# Patient Record
Sex: Female | Born: 1985
Health system: Southern US, Community
[De-identification: ages and names within clinical notes are randomized; demographics above are authoritative.]

## PROBLEM LIST (undated history)

## (undated) DIAGNOSIS — F32A Depression, unspecified: Secondary | ICD-10-CM

## (undated) DIAGNOSIS — F329 Major depressive disorder, single episode, unspecified: Secondary | ICD-10-CM

## (undated) DIAGNOSIS — D649 Anemia, unspecified: Secondary | ICD-10-CM

## (undated) HISTORY — DX: Major depressive disorder, single episode, unspecified: F32.9

## (undated) HISTORY — DX: Depression, unspecified: F32.A

## (undated) HISTORY — DX: Anemia, unspecified: D64.9

---

## 2010-12-07 HISTORY — PX: OTHER SURGICAL HISTORY: SHX169

## 2013-01-02 ENCOUNTER — Encounter (HOSPITAL_COMMUNITY): Payer: Self-pay | Admitting: Emergency Medicine

## 2013-01-02 ENCOUNTER — Emergency Department (HOSPITAL_COMMUNITY)
Admission: EM | Admit: 2013-01-02 | Discharge: 2013-01-02 | Disposition: A | Discharge status: Home / Self Care | Payer: BC Managed Care – PPO | Attending: Emergency Medicine | Admitting: Emergency Medicine

## 2013-01-02 ENCOUNTER — Emergency Department (HOSPITAL_COMMUNITY): Payer: BC Managed Care – PPO

## 2013-01-02 DIAGNOSIS — R109 Unspecified abdominal pain: Secondary | ICD-10-CM

## 2013-01-02 DIAGNOSIS — R1031 Right lower quadrant pain: Secondary | ICD-10-CM | POA: Insufficient documentation

## 2013-01-02 DIAGNOSIS — Z3202 Encounter for pregnancy test, result negative: Secondary | ICD-10-CM | POA: Insufficient documentation

## 2013-01-02 DIAGNOSIS — R630 Anorexia: Secondary | ICD-10-CM | POA: Insufficient documentation

## 2013-01-02 LAB — URINE MICROSCOPIC-ADD ON

## 2013-01-02 LAB — CBC WITH DIFFERENTIAL/PLATELET
Basophils Absolute: 0 10*3/uL (ref 0.0–0.1)
Eosinophils Absolute: 0.1 10*3/uL (ref 0.0–0.7)
Eosinophils Relative: 1 % (ref 0–5)
Hemoglobin: 15.3 g/dL — ABNORMAL HIGH (ref 12.0–15.0)
Lymphocytes Relative: 29 % (ref 12–46)
Lymphs Abs: 1.9 10*3/uL (ref 0.7–4.0)
MCH: 31.1 pg (ref 26.0–34.0)
MCV: 89.6 fL (ref 78.0–100.0)
Neutro Abs: 4.3 10*3/uL (ref 1.7–7.7)
Neutrophils Relative %: 64 % (ref 43–77)
Platelets: 361 10*3/uL (ref 150–400)
RBC: 4.92 MIL/uL (ref 3.87–5.11)
RDW: 12.8 % (ref 11.5–15.5)
WBC: 6.6 10*3/uL (ref 4.0–10.5)

## 2013-01-02 LAB — URINALYSIS, ROUTINE W REFLEX MICROSCOPIC
Bilirubin Urine: NEGATIVE
Glucose, UA: NEGATIVE mg/dL
Ketones, ur: NEGATIVE mg/dL
Nitrite: NEGATIVE
Specific Gravity, Urine: 1.02 (ref 1.005–1.030)
pH: 7.5 (ref 5.0–8.0)

## 2013-01-02 LAB — COMPREHENSIVE METABOLIC PANEL
Albumin: 4.3 g/dL (ref 3.5–5.2)
Alkaline Phosphatase: 80 U/L (ref 39–117)
BUN: 10 mg/dL (ref 6–23)
CO2: 26 mEq/L (ref 19–32)
Chloride: 102 mEq/L (ref 96–112)
Creatinine, Ser: 0.54 mg/dL (ref 0.50–1.10)
GFR calc Af Amer: >90 mL/min (ref >90)
GFR calc non Af Amer: >90 mL/min (ref >90)
Glucose, Bld: 102 mg/dL — ABNORMAL HIGH (ref 70–99)
Potassium: 3.8 mEq/L (ref 3.5–5.1)
Total Bilirubin: 0.4 mg/dL (ref 0.3–1.2)

## 2013-01-02 LAB — WET PREP, GENITAL: Yeast Wet Prep HPF POC: NONE SEEN

## 2013-01-02 LAB — POCT PREGNANCY, URINE: Preg Test, Ur: NEGATIVE

## 2013-01-02 MED ORDER — ONDANSETRON HCL 4 MG/2ML IJ SOLN
4.0000 mg | Freq: Once | INTRAMUSCULAR | Status: DC
  Filled 2013-01-02: qty 2

## 2013-01-02 MED ORDER — IOHEXOL 300 MG/ML  SOLN
25.0000 mL | INTRAMUSCULAR | Status: AC
  Administered 2013-01-02 (×2): 25 mL via ORAL

## 2013-01-02 MED ORDER — SODIUM CHLORIDE 0.9 % IV BOLUS (SEPSIS)
1000.0000 mL | Freq: Once | INTRAVENOUS | Status: AC
  Administered 2013-01-02: 1000 mL via INTRAVENOUS

## 2013-01-02 MED ORDER — OXYCODONE-ACETAMINOPHEN 5-325 MG PO TABS: 1.0000 | ORAL_TABLET | ORAL | Status: DC | PRN

## 2013-01-02 MED ORDER — ONDANSETRON HCL 4 MG/2ML IJ SOLN
4.0000 mg | Freq: Once | INTRAMUSCULAR | Status: AC
  Administered 2013-01-02: 4 mg via INTRAVENOUS

## 2013-01-02 MED ORDER — IOHEXOL 300 MG/ML  SOLN
100.0000 mL | Freq: Once | INTRAMUSCULAR | Status: AC | PRN
  Administered 2013-01-02: 100 mL via INTRAVENOUS

## 2013-01-02 MED ORDER — PROMETHAZINE HCL 25 MG PO TABS: 25.0000 mg | ORAL_TABLET | Freq: Four times a day (QID) | ORAL | Status: DC | PRN

## 2013-01-02 NOTE — ED Notes (Signed)
Pt reports difficulty having BM, pt states she has to strain to have a BM which is not normal. Pt states stools are hard when she is able to pass a BM.

## 2013-01-02 NOTE — ED Provider Notes (Signed)
CSN: [id number]     Arrival date & time 01/02/13  1338 History   First MD Initiated Contact with Patient 01/02/13 1508     Chief Complaint  Patient presents with  . Abdominal Pain   (Consider location/radiation/quality/duration/timing/severity/associated sxs/prior Treatment) HPI...Marland Kitchen. cramping right-sided abdominal pain for 3-4 days with associated anorexia.   Last menstrual period 5 days ago. No dysuria, fever, chills, vaginal bleeding, vaginal discharge. Nothing makes symptoms worse. No radiation of pain  No past medical history on file. No past surgical history on file. No family history on file. History  Substance Use Topics  . Smoking status: Never Smoker   . Smokeless tobacco: Not on file  . Alcohol Use: No   OB History   Grav Para Term Preterm Abortions TAB SAB Ect Mult Living                 Review of Systems  All other systems reviewed and are negative.    Allergies  Review of patient's allergies indicates no known allergies.  Home Medications  No current outpatient prescriptions on file. BP 97/47  Pulse 70  Temp(Src) 98.2 F (36.8 C) (Oral)  Resp 16  Ht 5\' 1"  (1.549 m)  Wt 112 lb (50.803 kg)  BMI 21.17 kg/m2  SpO2 100%  LMP 12/28/2012 Physical Exam  Nursing note and vitals reviewed. Constitutional: She is oriented to person, place, and time. She appears well-developed and well-nourished.  HENT:  Head: Normocephalic and atraumatic.  Eyes: Conjunctivae and EOM are normal. Pupils are equal, round, and reactive to light.  Neck: Normal range of motion. Neck supple.  Cardiovascular: Normal rate, regular rhythm and normal heart sounds.   Pulmonary/Chest: Effort normal and breath sounds normal.  Abdominal: Soft. Bowel sounds are normal.  Tender right lower quadrant  Genitourinary:  Normal external genitalia. Cervix has a small amount of old blood. No cervical motion tenderness. No adnexal tenderness.  Musculoskeletal: Normal range of motion.  Neurological:  She is alert and oriented to person, place, and time.  Skin: Skin is warm and dry.  Psychiatric: She has a normal mood and affect.    ED Course  Procedures (including critical care time) Labs Review Labs Reviewed  URINALYSIS, ROUTINE W REFLEX MICROSCOPIC - Abnormal; Notable for the following:    Hgb urine dipstick TRACE (*)    All other components within normal limits  COMPREHENSIVE METABOLIC PANEL - Abnormal; Notable for the following:    Glucose, Bld 102 (*)    ALT 44 (*)    All other components within normal limits  CBC WITH DIFFERENTIAL - Abnormal; Notable for the following:    Hemoglobin 15.3 (*)    All other components within normal limits  URINE MICROSCOPIC-ADD ON - Abnormal; Notable for the following:    Squamous Epithelial / LPF FEW (*)    All other components within normal limits  WET PREP, GENITAL  GC/CHLAMYDIA PROBE AMP  POCT PREGNANCY, URINE   Imaging Review No results found. Ct Abdomen Pelvis W Contrast  01/02/2013   CLINICAL DATA:  Right lower abdominal pain  EXAM: CT ABDOMEN AND PELVIS WITH CONTRAST  TECHNIQUE: Multidetector CT imaging of the abdomen and pelvis was performed using the standard protocol following bolus administration of intravenous contrast.  CONTRAST:  OMNIPAQUE IOHEXOL 300 MG/ML  SOLN  COMPARISON:  None.  FINDINGS: Minimal dependent atelectasis in the visualized lung bases. Unremarkable liver, gallbladder, spleen, adrenal glands, pancreas, kidneys, abdominal aorta. Stomach, small bowel, and colon nondilated. Gas and oral  contrast in the appendix. No significant adjacent inflammatory/edematous change. No extraluminal fluid collections to suggest abscess. A few prominent right lower quadrant mesenteric lymph nodes, nearly all less than 5mm short axis diameter. Urinary bladder physiologically distended. Uterus and adnexal regions unremarkable. No ascites. No free air. Portal vein patent. Lumbar spine unremarkable.  IMPRESSION: 1. No acute abnormality.  Normal appendix.   Electronically Signed   By: Oley Balm M.D.   On: 01/02/2013 20:34    EKG Interpretation   None       MDM  No diagnosis found. CT scan shows no evidence of appendicitis. Patient is ambulatory. No acute abdomen. Discharge medications Percocet and Phenergan 25 mg    Donnetta Hutching, MD 01/02/13 2155

## 2013-01-02 NOTE — ED Notes (Addendum)
PT reports her belly is hurting and feels very bloated since Saturday. RLQ pain that has spread to epigastric area. Reports chills as well. Pain comes and goes. Reports belly is nontender to palpation. Nothing makes pain worse or better.

## 2013-01-02 NOTE — ED Notes (Signed)
Pt states she has finished her contrast drink, Diplomatic Services operational officer called and informed CT.

## 2013-01-02 NOTE — ED Notes (Signed)
MD at Bedside.

## 2013-01-03 LAB — GC/CHLAMYDIA PROBE AMP
CT Probe RNA: NEGATIVE
GC Probe RNA: NEGATIVE

## 2014-01-28 ENCOUNTER — Other Ambulatory Visit: Payer: Self-pay | Admitting: Gastroenterology

## 2014-01-28 DIAGNOSIS — R1013 Epigastric pain: Secondary | ICD-10-CM

## 2014-02-09 ENCOUNTER — Encounter (HOSPITAL_COMMUNITY)
Admission: RE | Admit: 2014-02-09 | Discharge: 2014-02-09 | Disposition: A | Discharge status: Home / Self Care | Payer: BC Managed Care – PPO | Source: Ambulatory Visit | Attending: Gastroenterology | Admitting: Gastroenterology

## 2014-02-09 DIAGNOSIS — R1013 Epigastric pain: Secondary | ICD-10-CM | POA: Diagnosis present

## 2014-02-09 MED ORDER — SINCALIDE 5 MCG IJ SOLR
0.0200 ug/kg | Freq: Once | INTRAMUSCULAR | Status: AC
  Administered 2014-02-09: 5 ug via INTRAVENOUS

## 2014-02-09 MED ORDER — STERILE WATER FOR INJECTION IJ SOLN
INTRAMUSCULAR | Status: AC
  Filled 2014-02-09: qty 10

## 2014-02-09 MED ORDER — TECHNETIUM TC 99M MEBROFENIN IV KIT
5.0000 | PACK | Freq: Once | INTRAVENOUS | Status: AC | PRN
  Administered 2014-02-09: 5 via INTRAVENOUS

## 2014-02-09 MED ORDER — SINCALIDE 5 MCG IJ SOLR
INTRAMUSCULAR | Status: AC
  Filled 2014-02-09: qty 5

## 2015-12-28 ENCOUNTER — Ambulatory Visit (INDEPENDENT_AMBULATORY_CARE_PROVIDER_SITE_OTHER): Payer: BLUE CROSS/BLUE SHIELD

## 2015-12-28 ENCOUNTER — Ambulatory Visit (INDEPENDENT_AMBULATORY_CARE_PROVIDER_SITE_OTHER): Payer: BLUE CROSS/BLUE SHIELD | Admitting: Urgent Care

## 2015-12-28 ENCOUNTER — Encounter: Payer: Self-pay | Admitting: Urgent Care

## 2015-12-28 VITALS — BP 118/74 | HR 86 | Temp 97.6°F | Resp 16 | Ht 61.75 in | Wt 125.2 lb

## 2015-12-28 DIAGNOSIS — M25561 Pain in right knee: Secondary | ICD-10-CM | POA: Diagnosis not present

## 2015-12-28 DIAGNOSIS — S99921A Unspecified injury of right foot, initial encounter: Secondary | ICD-10-CM | POA: Diagnosis not present

## 2015-12-28 DIAGNOSIS — M7989 Other specified soft tissue disorders: Secondary | ICD-10-CM | POA: Diagnosis not present

## 2015-12-28 DIAGNOSIS — M79674 Pain in right toe(s): Secondary | ICD-10-CM | POA: Diagnosis not present

## 2015-12-28 DIAGNOSIS — S8001XA Contusion of right knee, initial encounter: Secondary | ICD-10-CM

## 2015-12-28 DIAGNOSIS — S92404A Nondisplaced unspecified fracture of right great toe, initial encounter for closed fracture: Secondary | ICD-10-CM

## 2015-12-28 DIAGNOSIS — W108XXA Fall (on) (from) other stairs and steps, initial encounter: Secondary | ICD-10-CM

## 2015-12-28 MED ORDER — NAPROXEN SODIUM 550 MG PO TABS: 550.0000 mg | ORAL_TABLET | Freq: Two times a day (BID) | ORAL | 1 refills | Status: DC

## 2015-12-28 MED ORDER — HYDROCODONE-ACETAMINOPHEN 5-325 MG PO TABS: 1.0000 | ORAL_TABLET | Freq: Four times a day (QID) | ORAL | 0 refills | Status: DC | PRN

## 2015-12-28 NOTE — Patient Instructions (Signed)
Toe Fracture °A toe fracture is a break in one of the toe bones (phalanges). °CAUSES °This condition may be caused by: °· Dropping a heavy object on your toe. °· Stubbing your toe. °· Overusing your toe or doing repetitive exercise. °· Twisting or stretching your toe out of place. °RISK FACTORS °This condition is more likely to develop in people who: °· Play contact sports. °· Have a bone disease. °· Have a low calcium level. °SYMPTOMS °The main symptoms of this condition are swelling and pain in the toe. The pain may get worse with standing or walking. Other symptoms include: °· Bruising. °· Stiffness. °· Numbness. °· A change in the way the toe looks. °· Broken bones that poke through the skin. °· Blood beneath the toenail. °DIAGNOSIS °This condition is diagnosed with a physical exam. You may also have X-rays. °TREATMENT  °Treatment for this condition depends on the type of fracture and its severity. Treatment may involve: °· Taping the broken toe to a toe that is next to it (buddy taping). This is the most common treatment for fractures in which the bone has not moved out of place (nondisplaced fracture). °· Wearing a shoe that has a wide, rigid sole to protect the toe and to limit its movement. °· Wearing a walking cast. °· Having a procedure to move the toe back into place. °· Surgery. This may be needed: °¨ If there are many pieces of broken bone that are out of place (displaced). °¨ If the toe joint breaks. °¨ If the bone breaks through the skin. °· Physical therapy. This is done to help regain movement and strength in the toe. °You may need follow-up X-rays to make sure that the bone is healing well and staying in position. °HOME CARE INSTRUCTIONS °If You Have a Cast: °· Do not stick anything inside the cast to scratch your skin. Doing that increases your risk of infection. °· Check the skin around the cast every day. Report any concerns to your health care provider. You may put lotion on dry skin around the  edges of the cast. Do not apply lotion to the skin underneath the cast. °· Do not put pressure on any part of the cast until it is fully hardened. This may take several hours. °· Keep the cast clean and dry. °Bathing °· Do not take baths, swim, or use a hot tub until your health care provider approves. Ask your health care provider if you can take showers. You may only be allowed to take sponge baths for bathing. °· If your health care provider approves bathing and showering, cover the cast or bandage (dressing) with a watertight plastic bag to protect it from water. Do not let the cast or dressing get wet. °Managing Pain, Stiffness, and Swelling °· If you do not have a cast, apply ice to the injured area, if directed. °¨ Put ice in a plastic bag. °¨ Place a towel between your skin and the bag. °¨ Leave the ice on for 20 minutes, 2-3 times per day. °· Move your toes often to avoid stiffness and to lessen swelling. °· Raise (elevate) the injured area above the level of your heart while you are sitting or lying down. °Driving °· Do not drive or operate heavy machinery while taking pain medicine. °· Do not drive while wearing a cast on a foot that you use for driving. °Activity °· Return to your normal activities as directed by your health care provider. Ask your health care   provider what activities are safe for you. °· Perform exercises daily as directed by your health care provider or physical therapist. °Safety °· Do not use the injured limb to support your body weight until your health care provider says that you can. Use crutches or other assistive devices as directed by your health care provider. °General Instructions °· If your toe was treated with buddy taping, follow your health care provider's instructions for changing the gauze and tape. Change it more often: °¨ The gauze and tape get wet. If this happens, dry the space between the toes. °¨ The gauze and tape are too tight and cause your toe to become pale  or numb. °· Wear a protective shoe as directed by your health care provider. If you were not given a protective shoe, wear sturdy, supportive shoes. Your shoes should not pinch your toes and should not fit tightly against your toes. °· Do not use any tobacco products, including cigarettes, chewing tobacco, or e-cigarettes. Tobacco can delay bone healing. If you need help quitting, ask your health care provider. °· Take medicines only as directed by your health care provider. °· Keep all follow-up visits as directed by your health care provider. This is important. °SEEK MEDICAL CARE IF: °· You have a fever. °· Your pain medicine is not helping. °· Your toe is cold. °· Your toe is numb. °· You still have pain after one week of rest and treatment. °· You still have pain after your health care provider has said that you can start walking again. °· You have pain, tingling, or numbness in your foot that is not going away. °SEEK IMMEDIATE MEDICAL CARE IF: °· You have severe pain. °· You have redness or inflammation in your toe that is getting worse. °· You have pain or numbness in your toe that is getting worse. °· Your toe turns blue. °  °This information is not intended to replace advice given to you by your health care provider. Make sure you discuss any questions you have with your health care provider. °  °Document Released: 02/10/2000 Document Revised: 11/03/2014 Document Reviewed: 12/09/2013 °Elsevier Interactive Patient Education ©2016 [employer] ° °

## 2015-12-28 NOTE — Progress Notes (Signed)
     IF you received an x-ray today, you will receive an invoice from [facility]. Please contact [facility] at [phone] with questions or concerns regarding your invoice.   IF you received labwork today, you will receive an invoice from [facility]. Please contact [facility] at [phone] with questions or concerns regarding your invoice.   Our billing staff will not be able to assist you with questions regarding bills from these companies.  You will be contacted with the lab results as soon as they are available. The fastest way to get your results is to activate your My Chart account. Instructions are located on the last page of this paperwork. If you have not heard from us regarding the results in 2 weeks, please contact this office.      

## 2015-12-28 NOTE — Progress Notes (Signed)
MRN: [id number] DOB: 04/22/1985  Subjective:   Sarah Haynes is a 30 y.o. female presenting for chief complaint of Toe Injury (R Big Toe 10/29)  Reports 2 day history of right big toe injury. Has since felt intermittent severe pain, worse with touch and bearing weight. She has also had swelling, difficulty ambulating, bruising, numbness over bottom of toe. Has tried icing, Bengay, ibuprofen with minimal relief.  Vernona RiegerLaura is not currently taking any medications. Also has No Known Allergies.  Vernona RiegerLaura  has a past medical history of Anemia and Depression. Also  has a past surgical history that includes bilateral symphatomy (12/07/2010).  Objective:   Vitals: BP 118/74 (BP Location: Right Arm, Patient Position: Sitting, Cuff Size: Normal)   Pulse 86   Temp 97.6 F (36.4 C) (Oral)   Resp 16   Ht 5' 1.75" (1.568 m)   Wt 125 lb 3.2 oz (56.8 kg)   LMP 12/14/2015 (Exact Date)   BMI 23.09 kg/m   Physical Exam  Constitutional: She is oriented to person, place, and time. She appears well-developed and well-nourished.  Cardiovascular: Normal rate.   Pulmonary/Chest: Effort normal.  Musculoskeletal:       Right knee: She exhibits decreased range of motion (full flexion), swelling (trace over inferior patella, patellar ligament) and ecchymosis (over inferior patella, patellar ligament). She exhibits no effusion, no deformity, no laceration, no erythema, normal alignment and normal patellar mobility. No medial joint line, no lateral joint line, no MCL, no LCL and no patellar tendon tenderness noted.       Right foot: There is decreased range of motion (flexion and extension of right great toe), tenderness (right great toe up to 1st MTP), bony tenderness (DIP>MTP of right great toe) and swelling (over plantar surface of right great toe with associated ecchymosis). There is normal capillary refill, no crepitus, no deformity and no laceration.  Neurological: She is alert and oriented to person, place, and  time.   Dg Knee Complete 4 Views Right  Result Date: 12/28/2015 CLINICAL DATA:  Right knee injury there is EXAM: RIGHT KNEE - COMPLETE 4+ VIEW COMPARISON:  None FINDINGS: No evidence of fracture, dislocation, or joint effusion. No evidence of arthropathy or other focal bone abnormality. Soft tissues are unremarkable. IMPRESSION: Negative. Electronically Signed   By: Signa Kellaylor  Stroud M.D.   On: 12/28/2015 14:42   Dg Toe Great Right  Result Date: 12/28/2015 CLINICAL DATA:  Great toe pain after falling 3 days ago. EXAM: RIGHT GREAT TOE COMPARISON:  None. FINDINGS: The mineralization and alignment are normal. There is ill-defined lucency projecting over the lateral base of the distal phalanx, suspicious for a nondisplaced fracture. The proximal phalanx and first metatarsal appear normal. No significant soft tissue abnormalities are seen. IMPRESSION: Probable nondisplaced intra-articular fracture of the distal first phalanx. Electronically Signed   By: Carey BullocksWilliam  Veazey M.D.   On: 12/28/2015 14:43   Assessment and Plan :   1. Injury of right great toe, initial encounter 2. Fall on steps, initial encounter 3. Great toe pain, right 4. Pain and swelling of toe of right foot 5. Acute pain of right knee 6. Closed nondisplaced fracture of phalanx of right great toe, unspecified phalanx, initial encounter - Referred to ortho urgently due to unclear displacement of intraarticular fracture of right great toe. Recommended no bearing weight for her right foot, use crutches and hard soled shoe. Schedule Anaprox with APAP and use hydrocodone for breakthrough pain.  Wallis BambergMario Andris Brothers, PA-C [facility]  [facility] [phone] 12/28/2015 1:59 PM

## 2016-01-02 ENCOUNTER — Encounter (INDEPENDENT_AMBULATORY_CARE_PROVIDER_SITE_OTHER): Payer: Self-pay | Admitting: Orthopaedic Surgery

## 2016-01-02 ENCOUNTER — Ambulatory Visit (INDEPENDENT_AMBULATORY_CARE_PROVIDER_SITE_OTHER): Payer: BLUE CROSS/BLUE SHIELD | Admitting: Orthopaedic Surgery

## 2016-01-02 DIAGNOSIS — S92424A Nondisplaced fracture of distal phalanx of right great toe, initial encounter for closed fracture: Secondary | ICD-10-CM

## 2016-01-02 NOTE — Progress Notes (Signed)
Office Visit Note   Patient: Sarah Haynes           Date of Birth: 03/06/1985           MRN: [id number] Visit Date: 01/02/2016              Requested by: Wallis BambergMario Mani, PA-C [address] [place], [place] [id number] PCP: Pcp Not In System   Assessment & Plan: Visit Diagnoses:  1. Nondisplaced fracture of distal phalanx of right great toe, initial encounter for closed fracture     Plan:  - ND right GT fx - WBAT in postop shoe - wean crutches as tolerated - f/u 4 weeks for clinical recheck  Follow-Up Instructions: Return in about 4 weeks (around 01/30/2016) for recheck right great toe fracture.   Orders:  No orders of the defined types were placed in this encounter.  No orders of the defined types were placed in this encounter.     Procedures: No procedures performed   Clinical Data: No additional findings.   Subjective: Chief Complaint  Patient presents with  . Right Foot - Injury, Pain    RIGHT GREAT TOE Fx, INJURED TOE SUNDAY  12/25/15. WENT TO URGENT CARE AND XRAYS WERE DONE, WEAR POST OP SHOE    30 yo healthy female who injured her right GT last week.  [facility] with ND distal phalanx fx.  In postop shoe and crutches today.  Not taking pain meds.  Pain is worse with weight bearing.  Pain doesn't radiate.  Pain throbs.    Review of Systems  Constitutional: Negative.   HENT: Negative.   Eyes: Negative.   Respiratory: Negative.   Cardiovascular: Negative.   Endocrine: Negative.   Musculoskeletal: Negative.   Neurological: Negative.   Hematological: Negative.   Psychiatric/Behavioral: Negative.   All other systems reviewed and are negative.    Objective: Vital Signs: LMP 12/14/2015 (Exact Date)   Physical Exam  Constitutional: She is oriented to person, place, and time. She appears well-developed and well-nourished.  HENT:  Head: Atraumatic.  Eyes: EOM are normal.  Neck: Neck supple.  Cardiovascular: Intact distal pulses.     Pulmonary/Chest: Effort normal.  Abdominal: Soft.  Neurological: She is alert and oriented to person, place, and time.  Skin: Skin is warm. Capillary refill takes less than 2 seconds.  Psychiatric: She has a normal mood and affect. Her behavior is normal. Judgment and thought content normal.  Nursing note and vitals reviewed.   Ortho Exam Bruising and swelling of right GT. Toenail stable Toe wwp Specialty Comments:  No specialty comments available.  Imaging: No results found.   PMFS History: Patient Active Problem List   Diagnosis Date Noted  . Nondisplaced fracture of distal phalanx of right great toe, initial encounter for closed fracture 01/02/2016   Past Medical History:  Diagnosis Date  . Anemia   . Depression     Family History  Problem Relation Age of Onset  . Cancer Mother   . Cancer Father   . Cancer Paternal Grandmother     Past Surgical History:  Procedure Laterality Date  . bilateral symphatomy  12/07/2010   Social History   Occupational History  . Not on file.   Social History Main Topics  . Smoking status: Never Smoker  . Smokeless tobacco: Never Used  . Alcohol use No  . Drug use: No  . Sexual activity: Yes    Birth control/ protection: None

## 2016-01-30 ENCOUNTER — Ambulatory Visit (INDEPENDENT_AMBULATORY_CARE_PROVIDER_SITE_OTHER): Payer: BLUE CROSS/BLUE SHIELD | Admitting: Orthopaedic Surgery

## 2016-04-23 ENCOUNTER — Ambulatory Visit (INDEPENDENT_AMBULATORY_CARE_PROVIDER_SITE_OTHER): Payer: BLUE CROSS/BLUE SHIELD | Admitting: Family Medicine

## 2016-04-23 ENCOUNTER — Ambulatory Visit (HOSPITAL_BASED_OUTPATIENT_CLINIC_OR_DEPARTMENT_OTHER)
Admission: RE | Admit: 2016-04-23 | Discharge: 2016-04-23 | Disposition: A | Discharge status: Home / Self Care | Payer: BLUE CROSS/BLUE SHIELD | Source: Ambulatory Visit | Attending: Family Medicine | Admitting: Family Medicine

## 2016-04-23 VITALS — BP 106/67 | HR 86 | Temp 98.1°F | Ht 61.75 in | Wt 124.0 lb

## 2016-04-23 DIAGNOSIS — R110 Nausea: Secondary | ICD-10-CM

## 2016-04-23 DIAGNOSIS — R1011 Right upper quadrant pain: Secondary | ICD-10-CM

## 2016-04-23 DIAGNOSIS — R3129 Other microscopic hematuria: Secondary | ICD-10-CM | POA: Diagnosis not present

## 2016-04-23 DIAGNOSIS — R5383 Other fatigue: Secondary | ICD-10-CM | POA: Diagnosis not present

## 2016-04-23 LAB — POCT URINALYSIS DIP (MANUAL ENTRY)
BILIRUBIN UA: NEGATIVE
Bilirubin, UA: NEGATIVE
Glucose, UA: NEGATIVE
Leukocytes, UA: NEGATIVE
Nitrite, UA: NEGATIVE
PROTEIN UA: NEGATIVE
SPEC GRAV UA: 1.02
Urobilinogen, UA: 0.2
pH, UA: 5.5

## 2016-04-23 LAB — POCT URINE PREGNANCY: Preg Test, Ur: NEGATIVE

## 2016-04-23 LAB — POCT CBC
Granulocyte percent: 64.3 %G (ref 37–80)
HCT, POC: 37.2 % — AB (ref 37.7–47.9)
Hemoglobin: 12.8 g/dL (ref 12.2–16.2)
LYMPH, POC: 1.6 (ref 0.6–3.4)
MCH, POC: 29.8 pg (ref 27–31.2)
MCHC: 34.3 g/dL (ref 31.8–35.4)
MCV: 86.6 fL (ref 80–97)
MID (cbc): 0.3 (ref 0–0.9)
MPV: 8.6 fL (ref 0–99.8)
PLATELET COUNT, POC: 295 10*3/uL (ref 142–424)
POC GRANULOCYTE: 3.5 (ref 2–6.9)
POC LYMPH PERCENT: 29.7 %L (ref 10–50)
POC MID %: 6 %M (ref 0–12)
RBC: 4.3 M/uL (ref 4.04–5.48)
RDW, POC: 13.5 %
WBC: 5.5 10*3/uL (ref 4.6–10.2)

## 2016-04-23 LAB — POC MICROSCOPIC URINALYSIS (UMFC): Mucus: ABSENT

## 2016-04-23 MED ORDER — ONDANSETRON HCL 4 MG PO TABS: 4.0000 mg | ORAL_TABLET | Freq: Three times a day (TID) | ORAL | 0 refills | Status: DC | PRN

## 2016-04-23 NOTE — Patient Instructions (Addendum)
You did have some blood on the urine test today, but no other signs of infection. I will check a urine culture, but if you have worsen of urine symptoms including burning, frequency of urination, or more lower abdominal pain, return for recheck.  I will check your liver tests, as well as order an ultrasound to look at your gallbladder. However if the pain worsens, return here or emergency room for recheck if needed. In the meantime, avoid fried/fatty foods, bland diet.   GO TO MED CENTER TONIGHT 8PM GO TO ER FOR OUTPATIENT REGISTRATION [address], [place], [place] [address]   Abdominal Pain, Adult Abdominal pain can be caused by many things. Often, abdominal pain is not serious and it gets better with no treatment or by being treated at home. However, sometimes abdominal pain is serious. Your health care provider will do a medical history and a physical exam to try to determine the cause of your abdominal pain. Follow these instructions at home:  Take over-the-counter and prescription medicines only as told by your health care provider. Do not take a laxative unless told by your health care provider.  Drink enough fluid to keep your urine clear or pale yellow.  Watch your condition for any changes.  Keep all follow-up visits as told by your health care provider. This is important. Contact a health care provider if:  Your abdominal pain changes or gets worse.  You are not hungry or you lose weight without trying.  You are constipated or have diarrhea for more than 2-3 days.  You have pain when you urinate or have a bowel movement.  Your abdominal pain wakes you up at night.  Your pain gets worse with meals, after eating, or with certain foods.  You are throwing up and cannot keep anything down.  You have a fever. Get help right away if:  Your pain does not go away as soon as your health care provider told you to expect.  You cannot stop throwing up.  Your pain is only in  areas of the abdomen, such as the right side or the left lower portion of the abdomen.  You have bloody or black stools, or stools that look like tar.  You have severe pain, cramping, or bloating in your abdomen.  You have signs of dehydration, such as:  Dark urine, very little urine, or no urine.  Cracked lips.  Dry mouth.  Sunken eyes.  Sleepiness.  Weakness. This information is not intended to replace advice given to you by your health care provider. Make sure you discuss any questions you have with your health care provider. Document Released: 11/22/2004 Document Revised: 09/02/2015 Document Reviewed: 07/27/2015 Elsevier Interactive Patient Education  2017 Elsevier Inc.  Hematuria, Adult Hematuria is blood in your urine. It can be caused by a bladder infection, kidney infection, prostate infection, kidney stone, or cancer of your urinary tract. Infections can usually be treated with medicine, and a kidney stone usually will pass through your urine. If neither of these is the cause of your hematuria, further workup to find out the reason may be needed. It is very important that you tell your health care provider about any blood you see in your urine, even if the blood stops without treatment or happens without causing pain. Blood in your urine that happens and then stops and then happens again can be a symptom of a very serious condition. Also, pain is not a symptom in the initial stages of  many urinary cancers. Follow these instructions at home:  Drink lots of fluid, 3-4 quarts a day. If you have been diagnosed with an infection, cranberry juice is especially recommended, in addition to large amounts of water.  Avoid caffeine, tea, and carbonated beverages because they tend to irritate the bladder.  Avoid alcohol because it may irritate the prostate.  Take all medicines as directed by your health care provider.  If you were prescribed an antibiotic medicine, finish it all  even if you start to feel better.  If you have been diagnosed with a kidney stone, follow your health care provider's instructions regarding straining your urine to catch the stone.  Empty your bladder often. Avoid holding urine for long periods of time.  After a bowel movement, women should cleanse front to back. Use each tissue only once.  Empty your bladder before and after sexual intercourse if you are a female. Contact a health care provider if:  You develop back pain.  You have a fever.  You have a feeling of sickness in your stomach (nausea) or vomiting.  Your symptoms are not better in 3 days. Return sooner if you are getting worse. Get help right away if:  You develop severe vomiting and are unable to keep the medicine down.  You develop severe back or abdominal pain despite taking your medicines.  You begin passing a large amount of blood or clots in your urine.  You feel extremely weak or faint, or you pass out. This information is not intended to replace advice given to you by your health care provider. Make sure you discuss any questions you have with your health care provider. Document Released: 02/12/2005 Document Revised: 07/21/2015 Document Reviewed: 10/13/2012 Elsevier Interactive Patient Education  2017 [employer].   IF you received an x-ray today, you will receive an invoice from [facility]. Please contact [facility] at [phone] with questions or concerns regarding your invoice.   IF you received labwork today, you will receive an invoice from [place]. Please contact [facility] at [phone] with questions or concerns regarding your invoice.   Our billing staff will not be able to assist you with questions regarding bills from these companies.  You will be contacted with the lab results as soon as they are available. The fastest way to get your results is to activate your My Chart account. Instructions are located on the last page  of this paperwork. If you have not heard from [place] regarding the results in 2 weeks, please contact this office.

## 2016-04-23 NOTE — Progress Notes (Signed)
By signing my name below, I, Mesha Guinyard, attest that this documentation has been prepared under the direction and in the presence of Meredith Staggers, MD.  Electronically Signed: Arvilla Market, Medical Scribe. 04/23/16. 3:24 PM.  Subjective:    Patient ID: Sarah Haynes, female    DOB: 1985/08/15, 31 y.o.   MRN: [id number]  HPI Chief Complaint  Patient presents with  . Abdominal Pain    X 2 week  . Fatigue    X 1 day    HPI Comments: Sarah Haynes is a 31 y.o. female who presents to the [facility] complaining of right abdominal 2 weeks ago. Her sxs began as a numbing sensation from her right upper abdomen to her legs that resolved after a day. She reports associated sxs of fatigue, and nausea today. LMP was last week and was nl. She's not taking any contraceptives. Pt had HIDA scan 6 years ago due to a "lazy" gallbladder and wasn't recommend surgery. She's been okay since without triggers, and was told she would eventually have to take it out. Pt drinks very little; not daily, and no frequent alcohol increase. Denies PMHx of surgeries. Denies emesis, vaginal bleeding, hematuria, trouble urinating, dysuria, urinary frequency, urinary urgency, and fever.  Patient Active Problem List   Diagnosis Date Noted  . Nondisplaced fracture of distal phalanx of right great toe, initial encounter for closed fracture 01/02/2016   Past Medical History:  Diagnosis Date  . Anemia   . Depression    Past Surgical History:  Procedure Laterality Date  . bilateral symphatomy  12/07/2010   No Known Allergies Prior to Admission medications   Medication Sig Start Date End Date Taking? Authorizing Provider  HYDROcodone-acetaminophen (NORCO) 5-325 MG tablet Take 1 tablet by mouth every 6 (six) hours as needed. Patient not taking: Reported on 01/02/2016 12/28/15   Wallis Bamberg, PA-C  naproxen sodium (ANAPROX DS) 550 MG tablet Take 1 tablet (550 mg total) by mouth 2 (two) times daily with  a meal. Patient not taking: Reported on 04/23/2016 12/28/15   Wallis Bamberg, PA-C   Social History   Social History  . Marital status: Single    Spouse name: N/A  . Number of children: N/A  . Years of education: N/A   Occupational History  . Not on file.   Social History Main Topics  . Smoking status: Never Smoker  . Smokeless tobacco: Never Used  . Alcohol use No  . Drug use: No  . Sexual activity: Yes    Birth control/ protection: None   Other Topics Concern  . Not on file   Social History Narrative  . No narrative on file   Review of Systems  Constitutional: Positive for fatigue. Negative for fever.  Gastrointestinal: Positive for abdominal pain and nausea. Negative for vomiting.  Genitourinary: Negative for difficulty urinating, dysuria, frequency, hematuria, menstrual problem, urgency and vaginal bleeding.  Neurological: Positive for numbness.   Objective:  Physical Exam  Constitutional: She appears well-developed and well-nourished. No distress.  HENT:  Head: Normocephalic and atraumatic.  Eyes: Conjunctivae are normal.  Neck: Neck supple.  Cardiovascular: Normal rate.   Pulmonary/Chest: Effort normal.  Abdominal: Soft. Normal appearance. There is tenderness in the right upper quadrant. There is positive Murphy's sign. There is no rebound and no guarding.  Neurological: She is alert.  Skin: Skin is warm and dry.  Psychiatric: She has a normal mood and affect. Her behavior is normal.  Nursing note and vitals reviewed.  Vitals:   04/23/16 1506  BP: 106/67  Pulse: 86  Temp: 98.1 F (36.7 C)  TempSrc: Oral  SpO2: 99%  Weight: 124 lb (56.2 kg)  Height: 5' 1.75" (1.568 m)  Body mass index is 22.86 kg/m.   Results for orders placed or performed in visit on 04/23/16  POCT CBC  Result Value Ref Range   WBC 5.5 4.6 - 10.2 K/uL   Lymph, poc 1.6 0.6 - 3.4   POC LYMPH PERCENT 29.7 10 - 50 %L   MID (cbc) 0.3 0 - 0.9   POC MID % 6.0 0 - 12 %M   POC Granulocyte  3.5 2 - 6.9   Granulocyte percent 64.3 37 - 80 %G   RBC 4.30 4.04 - 5.48 M/uL   Hemoglobin 12.8 12.2 - 16.2 g/dL   HCT, POC 16.1 (A) 09.6 - 47.9 %   MCV 86.6 80 - 97 fL   MCH, POC 29.8 27 - 31.2 pg   MCHC 34.3 31.8 - 35.4 g/dL   RDW, POC 04.5 %   Platelet Count, POC 295 142 - 424 K/uL   MPV 8.6 0 - 99.8 fL  POCT urinalysis dipstick  Result Value Ref Range   Color, UA yellow yellow   Clarity, UA clear clear   Glucose, UA negative negative   Bilirubin, UA negative negative   Ketones, POC UA negative negative   Spec Grav, UA 1.020    Blood, UA small (A) negative   pH, UA 5.5    Protein Ur, POC negative negative   Urobilinogen, UA 0.2    Nitrite, UA Negative Negative   Leukocytes, UA Negative Negative  POCT urine pregnancy  Result Value Ref Range   Preg Test, Ur Negative Negative  POCT Microscopic Urinalysis ([facility])  Result Value Ref Range   WBC,UR,HPF,POC None None WBC/hpf   RBC,UR,HPF,POC Few (A) None RBC/hpf   Bacteria Few (A) None, Too numerous to count   Mucus Absent Absent   Epithelial Cells, UR Per Microscopy Few (A) None, Too numerous to count cells/hpf   Assessment & Plan:    Sarah Haynes is a 31 y.o. female RUQ abdominal pain - Plan: Comprehensive metabolic panel, POCT CBC, POCT urinalysis dipstick, POCT urine pregnancy, POCT Microscopic Urinalysis ([facility]), US Abdomen Limited RUQ  Nausea without vomiting - Plan: Comprehensive metabolic panel, POCT urine pregnancy, POCT Microscopic Urinalysis ([facility]), US Abdomen Limited RUQ, ondansetron (ZOFRAN) 4 MG tablet  Fatigue, unspecified type - Plan: POCT CBC, POCT urine pregnancy, POCT Microscopic Urinalysis ([facility])  Other microscopic hematuria - Plan: Urine culture  Based on location, ultrasound of abdomen was ordered as well as CMP to look at gallbladder/liver. Reported history of possible functional gallbladder disorder/biliary dyskinesia. Few RBCs on urinalysis, will check urine culture but unlikely infection. Bland  foods, water, symptomatic care and RTC precautions were discussed.  Meds ordered this encounter  Medications  . ondansetron (ZOFRAN) 4 MG tablet    Sig: Take 1 tablet (4 mg total) by mouth every 8 (eight) hours as needed for nausea or vomiting.    Dispense:  10 tablet    Refill:  0   Patient Instructions    You did have some blood on the urine test today, but no other signs of infection. I will check a urine culture, but if you have worsen of urine symptoms including burning, frequency of urination, or more lower abdominal pain, return for recheck.  I will check your liver tests, as well as order an ultrasound to  look at your gallbladder. However if the pain worsens, return here or emergency room for recheck if needed. In the meantime, avoid fried/fatty foods, bland diet.   GO TO MED CENTER TONIGHT 8PM GO TO ER FOR OUTPATIENT REGISTRATION [address], [place], [place] [address]   Abdominal Pain, Adult Abdominal pain can be caused by many things. Often, abdominal pain is not serious and it gets better with no treatment or by being treated at home. However, sometimes abdominal pain is serious. Your health care provider will do a medical history and a physical exam to try to determine the cause of your abdominal pain. Follow these instructions at home:  Take over-the-counter and prescription medicines only as told by your health care provider. Do not take a laxative unless told by your health care provider.  Drink enough fluid to keep your urine clear or pale yellow.  Watch your condition for any changes.  Keep all follow-up visits as told by your health care provider. This is important. Contact a health care provider if:  Your abdominal pain changes or gets worse.  You are not hungry or you lose weight without trying.  You are constipated or have diarrhea for more than 2-3 days.  You have pain when you urinate or have a bowel movement.  Your abdominal pain wakes you up at  night.  Your pain gets worse with meals, after eating, or with certain foods.  You are throwing up and cannot keep anything down.  You have a fever. Get help right away if:  Your pain does not go away as soon as your health care provider told you to expect.  You cannot stop throwing up.  Your pain is only in areas of the abdomen, such as the right side or the left lower portion of the abdomen.  You have bloody or black stools, or stools that look like tar.  You have severe pain, cramping, or bloating in your abdomen.  You have signs of dehydration, such as:  Dark urine, very little urine, or no urine.  Cracked lips.  Dry mouth.  Sunken eyes.  Sleepiness.  Weakness. This information is not intended to replace advice given to you by your health care provider. Make sure you discuss any questions you have with your health care provider. Document Released: 11/22/2004 Document Revised: 09/02/2015 Document Reviewed: 07/27/2015 Elsevier Interactive Patient Education  2017 Elsevier Inc.  Hematuria, Adult Hematuria is blood in your urine. It can be caused by a bladder infection, kidney infection, prostate infection, kidney stone, or cancer of your urinary tract. Infections can usually be treated with medicine, and a kidney stone usually will pass through your urine. If neither of these is the cause of your hematuria, further workup to find out the reason may be needed. It is very important that you tell your health care provider about any blood you see in your urine, even if the blood stops without treatment or happens without causing pain. Blood in your urine that happens and then stops and then happens again can be a symptom of a very serious condition. Also, pain is not a symptom in the initial stages of many urinary cancers. Follow these instructions at home:  Drink lots of fluid, 3-4 quarts a day. If you have been diagnosed with an infection, cranberry juice is especially  recommended, in addition to large amounts of water.  Avoid caffeine, tea, and carbonated beverages because they tend to irritate the bladder.  Avoid alcohol because it  may irritate the prostate.  Take all medicines as directed by your health care provider.  If you were prescribed an antibiotic medicine, finish it all even if you start to feel better.  If you have been diagnosed with a kidney stone, follow your health care provider's instructions regarding straining your urine to catch the stone.  Empty your bladder often. Avoid holding urine for long periods of time.  After a bowel movement, women should cleanse front to back. Use each tissue only once.  Empty your bladder before and after sexual intercourse if you are a female. Contact a health care provider if:  You develop back pain.  You have a fever.  You have a feeling of sickness in your stomach (nausea) or vomiting.  Your symptoms are not better in 3 days. Return sooner if you are getting worse. Get help right away if:  You develop severe vomiting and are unable to keep the medicine down.  You develop severe back or abdominal pain despite taking your medicines.  You begin passing a large amount of blood or clots in your urine.  You feel extremely weak or faint, or you pass out. This information is not intended to replace advice given to you by your health care provider. Make sure you discuss any questions you have with your health care provider. Document Released: 02/12/2005 Document Revised: 07/21/2015 Document Reviewed: 10/13/2012 Elsevier Interactive Patient Education  2017 [employer].   IF you received an x-ray today, you will receive an invoice from [facility]. Please contact [facility] at [phone] with questions or concerns regarding your invoice.   IF you received labwork today, you will receive an invoice from [facility]. Please contact [facility] at [phone] with questions or  concerns regarding your invoice.   Our billing staff will not be able to assist you with questions regarding bills from these companies.  You will be contacted with the lab results as soon as they are available. The fastest way to get your results is to activate your My Chart account. Instructions are located on the last page of this paperwork. If you have not heard from [place] regarding the results in 2 weeks, please contact this office.       I personally performed the services described in this documentation, which was scribed in my presence. The recorded information has been reviewed and considered for accuracy and completeness, addended by me as needed, and agree with information above.  Signed,   Meredith Staggers, MD Primary [facility] at [facility].  04/25/16 4:56 PM

## 2016-04-24 LAB — COMPREHENSIVE METABOLIC PANEL
ALT: 15 IU/L (ref 0–32)
AST: 18 IU/L (ref 0–40)
Albumin/Globulin Ratio: 1.5 (ref 1.2–2.2)
Albumin: 4 g/dL (ref 3.5–5.5)
Alkaline Phosphatase: 70 IU/L (ref 39–117)
BUN/Creatinine Ratio: 14 (ref 9–23)
BUN: 8 mg/dL (ref 6–20)
Bilirubin Total: 0.3 mg/dL (ref 0.0–1.2)
CO2: 22 mmol/L (ref 18–29)
CREATININE: 0.56 mg/dL — AB (ref 0.57–1.00)
Calcium: 8.9 mg/dL (ref 8.7–10.2)
Chloride: 101 mmol/L (ref 96–106)
GFR calc Af Amer: 143 mL/min/{1.73_m2} (ref >59)
GFR calc non Af Amer: 124 mL/min/{1.73_m2} (ref >59)
GLUCOSE: 102 mg/dL — AB (ref 65–99)
Globulin, Total: 2.7 g/dL (ref 1.5–4.5)
Potassium: 4.4 mmol/L (ref 3.5–5.2)
SODIUM: 138 mmol/L (ref 134–144)
Total Protein: 6.7 g/dL (ref 6.0–8.5)

## 2016-04-25 LAB — URINE CULTURE

## 2016-04-26 ENCOUNTER — Encounter: Payer: Self-pay | Admitting: Nurse Practitioner

## 2016-04-26 ENCOUNTER — Other Ambulatory Visit: Payer: Self-pay | Admitting: Family Medicine

## 2016-04-26 DIAGNOSIS — R1011 Right upper quadrant pain: Secondary | ICD-10-CM

## 2016-05-08 ENCOUNTER — Ambulatory Visit: Payer: BLUE CROSS/BLUE SHIELD | Admitting: Nurse Practitioner

## 2016-05-16 ENCOUNTER — Ambulatory Visit (INDEPENDENT_AMBULATORY_CARE_PROVIDER_SITE_OTHER): Payer: BLUE CROSS/BLUE SHIELD | Admitting: Nurse Practitioner

## 2016-05-16 ENCOUNTER — Encounter: Payer: Self-pay | Admitting: Nurse Practitioner

## 2016-05-16 VITALS — BP 100/60 | HR 83 | Ht 61.75 in | Wt 121.0 lb

## 2016-05-16 DIAGNOSIS — R1011 Right upper quadrant pain: Secondary | ICD-10-CM | POA: Diagnosis not present

## 2016-05-16 DIAGNOSIS — K219 Gastro-esophageal reflux disease without esophagitis: Secondary | ICD-10-CM

## 2016-05-16 MED ORDER — RANITIDINE HCL 150 MG PO TABS: 150.0000 mg | ORAL_TABLET | Freq: Two times a day (BID) | ORAL | 3 refills | Status: DC

## 2016-05-16 NOTE — Patient Instructions (Signed)
If you are age 33 or older, your body mass index should be between 23-30. Your Body mass index is 22.31 kg/m. If this is out of the aforementioned range listed, please consider follow up with your Primary Care Provider.  If you are age 33 or younger, your body mass index should be between 19-25. Your Body mass index is 22.31 kg/m. If this is out of the aformentioned range listed, please consider follow up with your Primary Care Provider.   We have sent the following medications to your pharmacy for you to pick up at your convenience: Zantac 150 mg twice a day  Thank you for choosing [facility]

## 2016-05-16 NOTE — Progress Notes (Signed)
      HPI: Patient is a 31 yo female evaluated at Urgent Care late Feb for right abdominal pain, fatigue and nausea of two weeks duration. HIDA 2015 was normal.  Labs 2/26 including u/a, chem profile and CBC were normal. Ultrasound negative, no gallstones, CBD normal.   Vernona RiegerLaura gives a one month history of constant, non-radiating RUQ pain which is worse with consumption of fatty foods but also with bending.The pain nearly resolved a week ago after elimination of fatty foods but now she is having GERD symptoms. She describes "heat" in bottom of her chest. Tums help to some degree. No significant regurgitation. Symptoms worse after lunch meal. No nocturnal symptoms. No associated nausea. No cough, no SOB. No other general or GI complaints. She has lost a few pounds with elimination of fatty foods. She takes Excedrin twice a week for headaches.   Patient is late on her period but preg test neg at PCP office on 2/26. She did a preg test at home today and it was negative.    Past Medical History:  Diagnosis Date  . Anemia   . Depression     Past Surgical History:  Procedure Laterality Date  . bilateral symphatomy  12/07/2010   Family History  Problem Relation Age of Onset  . Cancer Mother   . Stomach cancer Mother   . Cancer Father   . Cancer Paternal Grandmother    Social History  Substance Use Topics  . Smoking status: Never Smoker  . Smokeless tobacco: Never Used  . Alcohol use No   No current outpatient prescriptions on file.   No current facility-administered medications for this visit.    No Known Allergies   Review of Systems: All systems reviewed and negative except where noted in HPI.    Physical Exam: BP 100/60   Pulse 83   Ht 5' 1.75" (1.568 m)   Wt 121 lb (54.9 kg)   LMP 04/19/2016 (Exact Date) Comment: patient is late on her period  SpO2 99%   BMI 22.31 kg/m  Constitutional:  Well-developed female in no acute distress. Psychiatric: Pleasant. Normal mood  and affect. Behavior is normal. EENT:  Conjunctivae are normal. No scleral icterus. Neck supple.  Cardiovascular: Normal rate, regular rhythm.  Pulmonary/chest: Effort normal and breath sounds normal. No wheezing, rales or rhonchi. Abdominal: Soft, nondistended, nontender. Bowel sounds active throughout. There are no masses palpable. No hepatomegaly. Extremities: no edema Lymphadenopathy: No cervical adenopathy noted. Neurological: Alert and oriented to person place and time. Skin: Skin is warm and dry. No rashes noted.   ASSESSMENT AND PLAN:  1. 31 yo female with one month history of constant non-radiating RUQ pain. HIDA a few years ago was normal. Recent ultrasound and labs normal. Pain has resolved after elimination of fatty foods. Wonder about musculoskeletal component since pain worse with bending and she has right lower rib cage tenderness on exam  today. A musculoskeletal origin of pain does explain the the relationship to fatty food however. Regardless, pain has resolved for now   2. GERD. Sensation of 'heat" in lower chest, worse after lunch meal.  Suspect GERD but she is significantly tender over xiphoid process so musculoskeletal pain also on list of DDx  -GERD literature given -trial of Zantac BID. Follow up with me in 3 weeks  .Willette ClusterPaula Othon Guardia, NP  05/16/2016, 9:27 AM  Cc:  Shade FloodGreene, Jeffrey R, MD

## 2016-05-17 NOTE — Progress Notes (Signed)
Agree with assessment and plan. If symptoms fail to improve with Zantac would place on PPI and can consider EGD. Thanks

## 2017-04-22 ENCOUNTER — Ambulatory Visit: Payer: BLUE CROSS/BLUE SHIELD | Admitting: Physician Assistant

## 2017-04-22 ENCOUNTER — Encounter: Payer: Self-pay | Admitting: Physician Assistant

## 2017-04-22 ENCOUNTER — Other Ambulatory Visit: Payer: Self-pay

## 2017-04-22 VITALS — BP 122/80 | HR 88 | Temp 98.5°F | Resp 18 | Ht 61.75 in | Wt 124.8 lb

## 2017-04-22 DIAGNOSIS — R1031 Right lower quadrant pain: Secondary | ICD-10-CM

## 2017-04-22 DIAGNOSIS — R102 Pelvic and perineal pain: Secondary | ICD-10-CM

## 2017-04-22 LAB — POCT CBC
GRANULOCYTE PERCENT: 61.7 % (ref 37–80)
HCT, POC: 42.6 % (ref 37.7–47.9)
HEMOGLOBIN: 14.3 g/dL (ref 12.2–16.2)
Lymph, poc: 1.9 (ref 0.6–3.4)
MCH: 28.9 pg (ref 27–31.2)
MCHC: 33.5 g/dL (ref 31.8–35.4)
MCV: 86.2 fL (ref 80–97)
MID (CBC): 0.4 (ref 0–0.9)
MPV: 8.6 fL (ref 0–99.8)
PLATELET COUNT, POC: 318 10*3/uL (ref 142–424)
POC Granulocyte: 3.6 (ref 2–6.9)
POC LYMPH PERCENT: 31.5 %L (ref 10–50)
POC MID %: 6.8 % (ref 0–12)
RBC: 4.94 M/uL (ref 4.04–5.48)
RDW, POC: 14 %
WBC: 5.9 10*3/uL (ref 4.6–10.2)

## 2017-04-22 LAB — POCT URINE PREGNANCY: PREG TEST UR: NEGATIVE

## 2017-04-22 LAB — POCT WET + KOH PREP
Trich by wet prep: ABSENT
Yeast by KOH: ABSENT
Yeast by wet prep: ABSENT

## 2017-04-22 MED ORDER — NAPROXEN 500 MG PO TABS: 500.0000 mg | ORAL_TABLET | Freq: Two times a day (BID) | ORAL | 0 refills | Status: DC

## 2017-04-22 NOTE — Progress Notes (Signed)
04/22/2017 2:27 PM   DOB: Mar 03, 1985 / MRN: [id number]  SUBJECTIVE:  Sarah Haynes is a 32 y.o. female presenting for right-sided lower abdominal pain.  Been present now for weeks.  Feels that she is getting worse.  She has not been taking anything for the pain.  She felt this was GYN in nature and made an appointment with her gynecologist and has this appointment near the end of this week.  She plans to keep that appointment.  She denies dysuria urgency and frequency.  She denies hematuria, history of stone.  She has No Known Allergies.   She  has a past medical history of Anemia and Depression.    She  reports that  has never smoked. she has never used smokeless tobacco. She reports that she does not drink alcohol or use drugs. She  reports that she currently engages in sexual activity. She reports using the following method of birth control/protection: None. The patient  has a past surgical history that includes bilateral symphatomy (12/07/2010).  Her family history includes Cancer in her father, mother, and paternal grandmother; Stomach cancer in her mother.  Review of Systems  Constitutional: Negative for chills, diaphoresis and fever.  Eyes: Negative.   Respiratory: Negative for cough, hemoptysis, sputum production, shortness of breath and wheezing.   Cardiovascular: Negative for chest pain, orthopnea and leg swelling.  Gastrointestinal: Positive for abdominal pain. Negative for blood in stool, constipation, diarrhea, heartburn, melena, nausea and vomiting.  Genitourinary: Negative for dysuria, flank pain, frequency, hematuria and urgency.  Skin: Negative for rash.  Neurological: Negative for dizziness, sensory change, speech change, focal weakness and headaches.    The problem list and medications were reviewed and updated by myself where necessary and exist elsewhere in the encounter.   OBJECTIVE:  BP 122/80 (BP Location: Left Arm, Patient Position: Sitting, Cuff Size: Normal)    Pulse 88   Temp 98.5 F (36.9 C) (Oral)   Resp 18   Ht 5' 1.75" (1.568 m)   Wt 124 lb 12.8 oz (56.6 kg)   LMP 04/19/2017   SpO2 98%   BMI 23.01 kg/m   Physical Exam  Constitutional: She is active.  Non-toxic appearance.  Cardiovascular: Normal rate, regular rhythm, S1 normal, S2 normal, normal heart sounds and intact distal pulses. Exam reveals no gallop, no friction rub and no decreased pulses.  No murmur heard. Pulmonary/Chest: Effort normal. No stridor. No tachypnea. No respiratory distress. She has no wheezes. She has no rales.  Abdominal: Soft. Normal appearance and bowel sounds are normal. She exhibits no distension and no mass. There is no rigidity, no rebound, no guarding and no CVA tenderness.    Musculoskeletal: She exhibits no edema.  Neurological: She is alert.  Skin: Skin is warm and dry. She is not diaphoretic. No pallor.    Results for orders placed or performed in visit on 04/22/17 (from the past 72 hour(s))  POCT urine pregnancy     Status: None   Collection Time: 04/22/17 12:37 PM  Result Value Ref Range   Preg Test, Ur Negative Negative  POCT CBC     Status: None   Collection Time: 04/22/17 12:37 PM  Result Value Ref Range   WBC 5.9 4.6 - 10.2 K/uL   Lymph, poc 1.9 0.6 - 3.4   POC LYMPH PERCENT 31.5 10 - 50 %L   MID (cbc) 0.4 0 - 0.9   POC MID % 6.8 0 - 12 %M   POC Granulocyte 3.6  2 - 6.9   Granulocyte percent 61.7 37 - 80 %G   RBC 4.94 4.04 - 5.48 M/uL   Hemoglobin 14.3 12.2 - 16.2 g/dL   HCT, POC 45.442.6 09.837.7 - 47.9 %   MCV 86.2 80 - 97 fL   MCH, POC 28.9 27 - 31.2 pg   MCHC 33.5 31.8 - 35.4 g/dL   RDW, POC 11.914.0 %   Platelet Count, POC 318 142 - 424 K/uL   MPV 8.6 0 - 99.8 fL  POCT Wet + KOH Prep     Status: Abnormal   Collection Time: 04/22/17 12:38 PM  Result Value Ref Range   Yeast by KOH Absent Absent   Yeast by wet prep Absent Absent   WBC by wet prep None (A) Few   Clue Cells Wet Prep HPF POC None None   Trich by wet prep Absent Absent     Bacteria Wet Prep HPF POC Few Few   Epithelial Cells By [employer]et Pref ([facility]) Few None, Few, Too numerous to count   RBC,UR,HPF,POC Moderate (A) None RBC/hpf    No results found.  ASSESSMENT AND PLAN:  Sarah Haynes was seen today for abdominal pain.  Diagnoses and all orders for this visit:  Acute right lower quadrant pain -     POCT urine pregnancy -     hCG, serum, qualitative -     POCT CBC -     CMP and Liver -     GC/Chlamydia Probe Amp -     Trichomonas vaginalis, RNA -     POCT Wet + KOH Prep -     Cancel: HIV antibody (with reflex)  Right adnexal tenderness: She is negative for pregnancy however I am going to send off a serum to ensure a negative here.  She is nontoxic in appearance.  I doubt a torsed ovary.  She does have a GYN appointment near the end of this week and advised that she go to his appointment.  I will release her labs for her review via my chart. -     naproxen (NAPROSYN) 500 MG tablet; Take 1 tablet (500 mg total) by mouth 2 (two) times daily with a meal.    The patient is advised to call or return to clinic if she does not see an improvement in symptoms, or to seek the care of the closest emergency department if she worsens with the above plan.   Deliah BostonMichael Clark, MHS, PA-C [facility] 04/22/2017 2:27 PM

## 2017-04-22 NOTE — Patient Instructions (Addendum)
  Please keep your ob appointment. Start the NSAID today. Okay to take tylenol 1000 mg every 8 hours.    IF you received an x-ray today, you will receive an invoice from [facility]. Please contact [facility] at [phone] with questions or concerns regarding your invoice.   IF you received labwork today, you will receive an invoice from [facility]. Please contact [facility] at [phone] with questions or concerns regarding your invoice.   Our billing staff will not be able to assist you with questions regarding bills from these companies.  You will be contacted with the lab results as soon as they are available. The fastest way to get your results is to activate your My Chart account. Instructions are located on the last page of this paperwork. If you have not heard from us regarding the results in 2 weeks, please contact this office.

## 2017-04-23 LAB — CMP AND LIVER
ALBUMIN: 4.4 g/dL (ref 3.5–5.5)
ALT: 39 IU/L — ABNORMAL HIGH (ref 0–32)
AST: 21 IU/L (ref 0–40)
Alkaline Phosphatase: 82 IU/L (ref 39–117)
BILIRUBIN, DIRECT: 0.1 mg/dL (ref 0.00–0.40)
BUN: 9 mg/dL (ref 6–20)
Bilirubin Total: 0.4 mg/dL (ref 0.0–1.2)
CALCIUM: 9.4 mg/dL (ref 8.7–10.2)
CO2: 23 mmol/L (ref 20–29)
CREATININE: 0.58 mg/dL (ref 0.57–1.00)
Chloride: 105 mmol/L (ref 96–106)
GFR calc non Af Amer: 121 mL/min/{1.73_m2} (ref >59)
GFR, EST AFRICAN AMERICAN: 140 mL/min/{1.73_m2} (ref >59)
Glucose: 100 mg/dL — ABNORMAL HIGH (ref 65–99)
POTASSIUM: 4.4 mmol/L (ref 3.5–5.2)
SODIUM: 142 mmol/L (ref 134–144)
TOTAL PROTEIN: 7.4 g/dL (ref 6.0–8.5)

## 2017-04-23 LAB — GC/CHLAMYDIA PROBE AMP
Chlamydia trachomatis, NAA: NEGATIVE
Neisseria gonorrhoeae by PCR: NEGATIVE

## 2017-04-23 LAB — HCG, SERUM, QUALITATIVE: hCG,Beta Subunit,Qual,Serum: NEGATIVE m[IU]/mL (ref <6)

## 2017-04-24 LAB — TRICHOMONAS VAGINALIS, PROBE AMP: Trich vag by NAA: NEGATIVE

## 2017-05-01 DIAGNOSIS — Z124 Encounter for screening for malignant neoplasm of cervix: Secondary | ICD-10-CM | POA: Diagnosis not present

## 2017-05-01 DIAGNOSIS — Z01419 Encounter for gynecological examination (general) (routine) without abnormal findings: Secondary | ICD-10-CM | POA: Diagnosis not present

## 2017-05-01 DIAGNOSIS — Z6823 Body mass index (BMI) 23.0-23.9, adult: Secondary | ICD-10-CM | POA: Diagnosis not present

## 2017-06-14 ENCOUNTER — Ambulatory Visit: Payer: Self-pay

## 2017-06-14 NOTE — Telephone Encounter (Signed)
Patient called in with c/o "abdominal pain." She says "it's been going on for 2 weeks, but worse today, it's an 8 on the scale, burning right in the center of my stomach. When it was like this years ago, I had a stomach ulcer. I started taking Zantac last week, but it's not helping. I can still eat without any problems, I don't have nausea or vomiting. The pain comes and goes, it goes away then comes right back." I asked about radiating pain, she says "no, just the upper abdomen." According to protocol, see PCP within 24 hours, appointment scheduled for tomorrow at 1100 with Dr. Neva SeatGreene, care advice given, patient verbalized understanding.  Reason for Disposition . [1] MODERATE pain (e.g., interferes with normal activities) AND [2] comes and goes (cramps) AND [3] present > 24 hours  (Exception: pain with Vomiting or Diarrhea - see that Guideline)  Answer Assessment - Initial Assessment Questions 1. LOCATION: "Where does it hurt?"      Center 2. RADIATION: "Does the pain shoot anywhere else?" (e.g., chest, back)    No 3. ONSET: "When did the pain begin?" (e.g., minutes, hours or days ago)      About 2 weeks, worse today 4. SUDDEN: "Gradual or sudden onset?"     Gradual 5. PATTERN "Does the pain come and go, or is it constant?"    - If constant: "Is it getting better, staying the same, or worsening?"      (Note: Constant means the pain never goes away completely; most serious pain is constant and it progresses)     - If intermittent: "How long does it last?" "Do you have pain now?"     (Note: Intermittent means the pain goes away completely between bouts)     Comes and goes 6. SEVERITY: "How bad is the pain?"  (e.g., Scale 1-10; mild, moderate, or severe)    - MILD (1-3): doesn't interfere with normal activities, abdomen soft and not tender to touch     - MODERATE (4-7): interferes with normal activities or awakens from sleep, tender to touch     - SEVERE (8-10): excruciating pain, doubled over,  unable to do any normal activities       8 7. RECURRENT SYMPTOM: "Have you ever had this type of abdominal pain before?" If so, ask: "When was the last time?" and "What happened that time?"      Yes, had an ulcer 8. AGGRAVATING FACTORS: "Does anything seem to cause this pain?" (e.g., foods, stress, alcohol)     Nothing 9. CARDIAC SYMPTOMS: "Do you have any of the following symptoms: chest pain, difficulty breathing, sweating, nausea?"     No 10. OTHER SYMPTOMS: "Do you have any other symptoms?" (e.g., fever, vomiting, diarrhea)       Just shivering 11. PREGNANCY: "Is there any chance you are pregnant?" "When was your last menstrual period?"       No-LMP about 3 weeks ago  Protocols used: ABDOMINAL PAIN - UPPER-A-AH

## 2017-06-15 ENCOUNTER — Other Ambulatory Visit: Payer: Self-pay

## 2017-06-15 ENCOUNTER — Ambulatory Visit: Payer: BLUE CROSS/BLUE SHIELD | Admitting: Family Medicine

## 2017-06-15 ENCOUNTER — Encounter: Payer: Self-pay | Admitting: Family Medicine

## 2017-06-15 VITALS — BP 110/78 | HR 92 | Temp 98.2°F | Ht 61.0 in | Wt 123.6 lb

## 2017-06-15 DIAGNOSIS — R1013 Epigastric pain: Secondary | ICD-10-CM

## 2017-06-15 DIAGNOSIS — Z8711 Personal history of peptic ulcer disease: Secondary | ICD-10-CM

## 2017-06-15 DIAGNOSIS — R945 Abnormal results of liver function studies: Secondary | ICD-10-CM | POA: Diagnosis not present

## 2017-06-15 DIAGNOSIS — R7989 Other specified abnormal findings of blood chemistry: Secondary | ICD-10-CM

## 2017-06-15 DIAGNOSIS — R197 Diarrhea, unspecified: Secondary | ICD-10-CM | POA: Diagnosis not present

## 2017-06-15 LAB — POC MICROSCOPIC URINALYSIS (UMFC)

## 2017-06-15 LAB — POCT CBC
Granulocyte percent: 59.8 %G (ref 37–80)
HCT, POC: 43.4 % (ref 37.7–47.9)
Hemoglobin: 14.5 g/dL (ref 12.2–16.2)
LYMPH, POC: 2 (ref 0.6–3.4)
MCH: 28.4 pg (ref 27–31.2)
MCHC: 33.5 g/dL (ref 31.8–35.4)
MCV: 84.9 fL (ref 80–97)
MID (CBC): 1.6 — AB (ref 0–0.9)
MPV: 8.6 fL (ref 0–99.8)
POC Granulocyte: 5.3 (ref 2–6.9)
POC LYMPH PERCENT: 22 %L (ref 10–50)
POC MID %: 18.2 % — AB (ref 0–12)
Platelet Count, POC: 308 10*3/uL (ref 142–424)
RBC: 5.11 M/uL (ref 4.04–5.48)
RDW, POC: 13.6 %
WBC: 8.9 10*3/uL (ref 4.6–10.2)

## 2017-06-15 LAB — POCT URINALYSIS DIP (MANUAL ENTRY)
BILIRUBIN UA: NEGATIVE
Blood, UA: NEGATIVE
GLUCOSE UA: NEGATIVE mg/dL
Ketones, POC UA: NEGATIVE mg/dL
LEUKOCYTES UA: NEGATIVE
NITRITE UA: NEGATIVE
Protein Ur, POC: NEGATIVE mg/dL
Spec Grav, UA: 1.015 (ref 1.010–1.025)
Urobilinogen, UA: 0.2 E.U./dL
pH, UA: 8.5 — AB (ref 5.0–8.0)

## 2017-06-15 LAB — POCT URINE PREGNANCY: PREG TEST UR: NEGATIVE

## 2017-06-15 MED ORDER — OMEPRAZOLE 20 MG PO CPDR: 20.0000 mg | DELAYED_RELEASE_CAPSULE | Freq: Two times a day (BID) | ORAL | 1 refills | Status: DC

## 2017-06-15 MED ORDER — SUCRALFATE 1 GM/10ML PO SUSP: 1.0000 g | Freq: Three times a day (TID) | ORAL | 0 refills | Status: DC

## 2017-06-15 NOTE — Patient Instructions (Addendum)
Blood counts and urine tests are reassuring here today.  I would like you to start omeprazole twice per day for now, carafate with meals and bedtime (4 times per day if needed), bland diet, avoid fried food, spicy foods, and avoid NSAIDs for now. Make sure to drink small sips of fluids (water is best) frequently.  I will refer you to gastroenterology for evaluation this week if possible.  If any worsening abdominal pain in the next few days, unable to tolerate fluids, worsening lightheadedness or dizziness, or other worsening symptoms be seen here or the emergency room.   Diarrhea, Adult Diarrhea is frequent loose and watery bowel movements. Diarrhea can make you feel weak and cause you to become dehydrated. Dehydration can make you tired and thirsty, cause you to have a dry mouth, and decrease how often you urinate. Diarrhea typically lasts 2-3 days. However, it can last longer if it is a sign of something more serious. It is important to treat your diarrhea as told by your health care provider. Follow these instructions at home: Eating and drinking  Follow these recommendations as told by your health care provider:  Take an oral rehydration solution (ORS). This is a drink that is sold at pharmacies and retail stores.  Drink clear fluids, such as water, ice chips, diluted fruit juice, and low-calorie sports drinks.  Eat bland, easy-to-digest foods in small amounts as you are able. These foods include bananas, applesauce, rice, lean meats, toast, and crackers.  Avoid drinking fluids that contain a lot of sugar or caffeine, such as energy drinks, sports drinks, and soda.  Avoid alcohol.  Avoid spicy or fatty foods.  General instructions  Drink enough fluid to keep your urine clear or pale yellow.  Wash your hands often. If soap and water are not available, use hand sanitizer.  Make sure that all people in your household wash their hands well and often.  Take over-the-counter and  prescription medicines only as told by your health care provider.  Rest at home while you recover.  Watch your condition for any changes.  Take a warm bath to relieve any burning or pain from frequent diarrhea episodes.  Keep all follow-up visits as told by your health care provider. This is important. Contact a health care provider if:  You have a fever.  Your diarrhea gets worse.  You have new symptoms.  You cannot keep fluids down.  You feel light-headed or dizzy.  You have a headache  You have muscle cramps. Get help right away if:  You have chest pain.  You feel extremely weak or you faint.  You have bloody or black stools or stools that look like tar.  You have severe pain, cramping, or bloating in your abdomen.  You have trouble breathing or you are breathing very quickly.  Your heart is beating very quickly.  Your skin feels cold and clammy.  You feel confused.  You have signs of dehydration, such as: ? Dark urine, very little urine, or no urine. ? Cracked lips. ? Dry mouth. ? Sunken eyes. ? Sleepiness. ? Weakness. This information is not intended to replace advice given to you by your health care provider. Make sure you discuss any questions you have with your health care provider. Document Released: 02/02/2002 Document Revised: 06/23/2015 Document Reviewed: 10/19/2014 Elsevier Interactive Patient Education  2018 [employer]vier Inc.   Abdominal Pain, Adult Abdominal pain can be caused by many things. Often, abdominal pain is not serious and it gets better  with no treatment or by being treated at home. However, sometimes abdominal pain is serious. Your health care provider will do a medical history and a physical exam to try to determine the cause of your abdominal pain. Follow these instructions at home:  Take over-the-counter and prescription medicines only as told by your health care provider. Do not take a laxative unless told by your health care  provider.  Drink enough fluid to keep your urine clear or pale yellow.  Watch your condition for any changes.  Keep all follow-up visits as told by your health care provider. This is important. Contact a health care provider if:  Your abdominal pain changes or gets worse.  You are not hungry or you lose weight without trying.  You are constipated or have diarrhea for more than 2-3 days.  You have pain when you urinate or have a bowel movement.  Your abdominal pain wakes you up at night.  Your pain gets worse with meals, after eating, or with certain foods.  You are throwing up and cannot keep anything down.  You have a fever. Get help right away if:  Your pain does not go away as soon as your health care provider told you to expect.  You cannot stop throwing up.  Your pain is only in areas of the abdomen, such as the right side or the left lower portion of the abdomen.  You have bloody or black stools, or stools that look like tar.  You have severe pain, cramping, or bloating in your abdomen.  You have signs of dehydration, such as: ? Dark urine, very little urine, or no urine. ? Cracked lips. ? Dry mouth. ? Sunken eyes. ? Sleepiness. ? Weakness. This information is not intended to replace advice given to you by your health care provider. Make sure you discuss any questions you have with your health care provider. Document Released: 11/22/2004 Document Revised: 09/02/2015 Document Reviewed: 07/27/2015 Elsevier Interactive Patient Education  2018 [employer]   IF you received an x-ray today, you will receive an invoice from [facility]. Please contact [facility] at [phone] with questions or concerns regarding your invoice.   IF you received labwork today, you will receive an invoice from [facility]. Please contact [facility] at [phone] with questions or concerns regarding your invoice.   Our billing staff will not be able to assist you  with questions regarding bills from these companies.  You will be contacted with the lab results as soon as they are available. The fastest way to get your results is to activate your My Chart account. Instructions are located on the last page of this paperwork. If you have not heard from us regarding the results in 2 weeks, please contact this office.

## 2017-06-15 NOTE — Progress Notes (Signed)
Subjective:  By signing my name below, I, Stann Ore, attest that this documentation has been prepared under the direction and in the presence of Meredith Staggers, MD. Electronically Signed: Stann Ore, Scribe. 06/15/2017 , 11:31 AM .  Patient was seen in Room 13 .   Patient ID: Sarah Haynes, female    DOB: 05-Nov-1985, 32 y.o.   MRN: [id number] Chief Complaint  Patient presents with  . Mid Abdomial pain    Burning pain on and off (last night pain 8/10) X2 weeks  . Diarrhea    last night    HPI Sarah Haynes is a 32 y.o. female  Here for abdominal pain. Patient was last seen in February for RLQ abdominal pain. At that visit, pain present for weeks but at that time, getting worse. At that time, she also had right adnexal tenderness. Planned for follow up with OBGYN Philip Aspen. She had a negative GC/CT and negative trichomonas at that time, negative HCG and CMP was overall normal at that time with borderline elevated ALT of 39, normal CBC.   Patient states having intermittent mid upper abdominal pain that started about 2 weeks ago with some lightheadedness and fatigue. She informs she's had diarrhea once 3 days ago and diarrhea multiple times last night. She had a fever of 100.3*F last night. She denies diarrhea this morning. She denies recent antibiotics. She denies similar sick contact at home. She denies vomiting. Her LMP was 3 weeks ago, which was normal. She's been drinking plenty of water, with milk last night with some relief. She notes eating some foods (banana and crackers) last night and feeling some improvement. She denies any new vaginal discharge. She denies history of STI.   She mentions having a family cookout about 2 weeks ago, so she initially thought it was GERD. She took Tums without relief, and then Zantac. She's felt a little nauseated with this. She took Mylanta last night, after recommended by phone nurse. She mentions history of an ulcer 13 years ago. She denies  blood in stool or dark, tarry stool. She's taken naproxen for a week previously, but not recently. She had negative H. Pylori testing done by Dr. Loreta Ave about 4 years ago. She's had 2 beers last weekend and only drinks socially with family.   Patient Active Problem List   Diagnosis Date Noted  . Nondisplaced fracture of distal phalanx of right great toe, initial encounter for closed fracture 01/02/2016   Past Medical History:  Diagnosis Date  . Anemia   . Depression    Past Surgical History:  Procedure Laterality Date  . bilateral symphatomy  12/07/2010   No Known Allergies Prior to Admission medications   Medication Sig Start Date End Date Taking? Authorizing Provider  naproxen (NAPROSYN) 500 MG tablet Take 1 tablet (500 mg total) by mouth 2 (two) times daily with a meal. 04/22/17   Ofilia Neas, PA-C  ranitidine (ZANTAC) 150 MG tablet Take 1 tablet (150 mg total) by mouth 2 (two) times daily. Patient not taking: Reported on 04/22/2017 05/16/16   Meredith Pel, NP   Social History   Socioeconomic History  . Marital status: Single    Spouse name: Not on file  . Number of children: Not on file  . Years of education: Not on file  . Highest education level: Not on file  Occupational History  . Not on file  Social Needs  . Financial resource strain: Not on file  . Food insecurity:  Worry: Not on file    Inability: Not on file  . Transportation needs:    Medical: Not on file    Non-medical: Not on file  Tobacco Use  . Smoking status: Never Smoker  . Smokeless tobacco: Never Used  Substance and Sexual Activity  . Alcohol use: No  . Drug use: No  . Sexual activity: Yes    Birth control/protection: None  Lifestyle  . Physical activity:    Days per week: Not on file    Minutes per session: Not on file  . Stress: Not on file  Relationships  . Social connections:    Talks on phone: Not on file    Gets together: Not on file    Attends religious service: Not on file      Active member of club or organization: Not on file    Attends meetings of clubs or organizations: Not on file    Relationship status: Not on file  . Intimate partner violence:    Fear of current or ex partner: Not on file    Emotionally abused: Not on file    Physically abused: Not on file    Forced sexual activity: Not on file  Other Topics Concern  . Not on file  Social History Narrative  . Not on file   Review of Systems  Constitutional: Positive for fatigue. Negative for chills, fever and unexpected weight change.  Respiratory: Negative for cough.   Gastrointestinal: Positive for abdominal pain, diarrhea and nausea. Negative for blood in stool, constipation and vomiting.  Skin: Negative for rash and wound.  Neurological: Positive for light-headedness. Negative for dizziness, weakness and headaches.       Objective:   Physical Exam  Constitutional: She is oriented to person, place, and time. She appears well-developed and well-nourished. No distress.  HENT:  Head: Normocephalic and atraumatic.  Eyes: Pupils are equal, round, and reactive to light. EOM are normal.  Neck: Neck supple.  Cardiovascular: Normal rate.  Pulmonary/Chest: Effort normal. No respiratory distress.  Abdominal: There is tenderness (RUQ + mid epigastric) in the right upper quadrant and epigastric area. There is no CVA tenderness, no tenderness at McBurney's point and negative Murphy's sign.  Musculoskeletal: Normal range of motion.  Neurological: She is alert and oriented to person, place, and time.  Skin: Skin is warm and dry.  Psychiatric: She has a normal mood and affect. Her behavior is normal.  Nursing note and vitals reviewed.   Vitals:   06/15/17 1127  BP: 110/78  Pulse: 92  Temp: 98.2 F (36.8 C)  TempSrc: Oral  SpO2: 98%  Weight: 123 lb 9.6 oz (56.1 kg)  Height: 5\' 1"  (1.549 m)   Results for orders placed or performed in visit on 06/15/17  POCT CBC  Result Value Ref Range   WBC  8.9 4.6 - 10.2 K/uL   Lymph, poc 2.0 0.6 - 3.4   POC LYMPH PERCENT 22.0 10 - 50 %L   MID (cbc) 1.6 (A) 0 - 0.9   POC MID % 18.2 (A) 0 - 12 %M   POC Granulocyte 5.3 2 - 6.9   Granulocyte percent 59.8 37 - 80 %G   RBC 5.11 4.04 - 5.48 M/uL   Hemoglobin 14.5 12.2 - 16.2 g/dL   HCT, POC 46.9 62.9 - 47.9 %   MCV 84.9 80 - 97 fL   MCH, POC 28.4 27 - 31.2 pg   MCHC 33.5 31.8 - 35.4 g/dL   RDW, POC  13.6 %   Platelet Count, POC 308 142 - 424 K/uL   MPV 8.6 0 - 99.8 fL  POCT urinalysis dipstick  Result Value Ref Range   Color, UA yellow yellow   Clarity, UA clear clear   Glucose, UA negative negative mg/dL   Bilirubin, UA negative negative   Ketones, POC UA negative negative mg/dL   Spec Grav, UA 1.610 9.604 - 1.025   Blood, UA negative negative   pH, UA 8.5 (A) 5.0 - 8.0   Protein Ur, POC negative negative mg/dL   Urobilinogen, UA 0.2 0.2 or 1.0 E.U./dL   Nitrite, UA Negative Negative   Leukocytes, UA Negative Negative  POCT Microscopic Urinalysis ([facility])  Result Value Ref Range   WBC,UR,HPF,POC None None WBC/hpf   RBC,UR,HPF,POC None None RBC/hpf   Bacteria None None, Too numerous to count   Mucus Present (A) Absent   Epithelial Cells, UR Per Microscopy Few (A) None, Too numerous to count cells/hpf  POCT urine pregnancy  Result Value Ref Range   Preg Test, Ur Negative Negative       Assessment & Plan:    Sarah Haynes is a 32 y.o. female Abdominal pain, epigastric - Plan: POCT urinalysis dipstick, POCT Microscopic Urinalysis ([facility]), POCT urine pregnancy, Orthostatic vital signs, H. pylori breath test, omeprazole (PRILOSEC) 20 MG capsule, Ambulatory referral to Gastroenterology, sucralfate (CARAFATE) 1 GM/10ML suspension  History of peptic ulcer - Plan: POCT CBC, H. pylori breath test, omeprazole (PRILOSEC) 20 MG capsule, Ambulatory referral to Gastroenterology, sucralfate (CARAFATE) 1 GM/10ML suspension  Elevated LFTs - Plan: Comprehensive metabolic panel  Diarrhea,  unspecified type - Plan: GI Profile, Stool, PCR  Abdominal pain as above with associated diarrhea recently.  Differential includes viral illness with the diarrhea, that has now improved.  Reported history of peptic ulcer disease, but reassuring CBC at present, reassuring urinalysis and vital signs.   -Will start omeprazole 20 mg twice daily, Carafate 4 times per day, and refer to gastroenterology as may need endosopy to R/o PUD.   ER precautions discussed if any acute worsening of abdominal pain, lightheadedness or dizziness.  -Check GI profile stool with associated diarrhea and pain.  We will also check CMP as borderline elevated LFT previously.  ER/RTC precautions.  Meds ordered this encounter  Medications  . omeprazole (PRILOSEC) 20 MG capsule    Sig: Take 1 capsule (20 mg total) by mouth 2 (two) times daily before a meal.    Dispense:  60 capsule    Refill:  1  . sucralfate (CARAFATE) 1 GM/10ML suspension    Sig: Take 10 mLs (1 g total) by mouth 4 (four) times daily -  with meals and at bedtime.    Dispense:  420 mL    Refill:  0   Patient Instructions    Blood counts and urine tests are reassuring here today.  I would like you to start omeprazole twice per day for now, carafate with meals and bedtime (4 times per day if needed), bland diet, avoid fried food, spicy foods, and avoid NSAIDs for now. Make sure to drink small sips of fluids (water is best) frequently.  I will refer you to gastroenterology for evaluation this week if possible.  If any worsening abdominal pain in the next few days, unable to tolerate fluids, worsening lightheadedness or dizziness, or other worsening symptoms be seen here or the emergency room.   Diarrhea, Adult Diarrhea is frequent loose and watery bowel movements. Diarrhea can make you feel weak  and cause you to become dehydrated. Dehydration can make you tired and thirsty, cause you to have a dry mouth, and decrease how often you urinate. Diarrhea typically  lasts 2-3 days. However, it can last longer if it is a sign of something more serious. It is important to treat your diarrhea as told by your health care provider. Follow these instructions at home: Eating and drinking  Follow these recommendations as told by your health care provider:  Take an oral rehydration solution (ORS). This is a drink that is sold at pharmacies and retail stores.  Drink clear fluids, such as water, ice chips, diluted fruit juice, and low-calorie sports drinks.  Eat bland, easy-to-digest foods in small amounts as you are able. These foods include bananas, applesauce, rice, lean meats, toast, and crackers.  Avoid drinking fluids that contain a lot of sugar or caffeine, such as energy drinks, sports drinks, and soda.  Avoid alcohol.  Avoid spicy or fatty foods.  General instructions  Drink enough fluid to keep your urine clear or pale yellow.  Wash your hands often. If soap and water are not available, use hand sanitizer.  Make sure that all people in your household wash their hands well and often.  Take over-the-counter and prescription medicines only as told by your health care provider.  Rest at home while you recover.  Watch your condition for any changes.  Take a warm bath to relieve any burning or pain from frequent diarrhea episodes.  Keep all follow-up visits as told by your health care provider. This is important. Contact a health care provider if:  You have a fever.  Your diarrhea gets worse.  You have new symptoms.  You cannot keep fluids down.  You feel light-headed or dizzy.  You have a headache  You have muscle cramps. Get help right away if:  You have chest pain.  You feel extremely weak or you faint.  You have bloody or black stools or stools that look like tar.  You have severe pain, cramping, or bloating in your abdomen.  You have trouble breathing or you are breathing very quickly.  Your heart is beating very  quickly.  Your skin feels cold and clammy.  You feel confused.  You have signs of dehydration, such as: ? Dark urine, very little urine, or no urine. ? Cracked lips. ? Dry mouth. ? Sunken eyes. ? Sleepiness. ? Weakness. This information is not intended to replace advice given to you by your health care provider. Make sure you discuss any questions you have with your health care provider. Document Released: 02/02/2002 Document Revised: 06/23/2015 Document Reviewed: 10/19/2014 Elsevier Interactive Patient Education  2018 [employer]   Abdominal Pain, Adult Abdominal pain can be caused by many things. Often, abdominal pain is not serious and it gets better with no treatment or by being treated at home. However, sometimes abdominal pain is serious. Your health care provider will do a medical history and a physical exam to try to determine the cause of your abdominal pain. Follow these instructions at home:  Take over-the-counter and prescription medicines only as told by your health care provider. Do not take a laxative unless told by your health care provider.  Drink enough fluid to keep your urine clear or pale yellow.  Watch your condition for any changes.  Keep all follow-up visits as told by your health care provider. This is important. Contact a health care provider if:  Your abdominal pain changes or gets worse.  You are not hungry or you lose weight without trying.  You are constipated or have diarrhea for more than 2-3 days.  You have pain when you urinate or have a bowel movement.  Your abdominal pain wakes you up at night.  Your pain gets worse with meals, after eating, or with certain foods.  You are throwing up and cannot keep anything down.  You have a fever. Get help right away if:  Your pain does not go away as soon as your health care provider told you to expect.  You cannot stop throwing up.  Your pain is only in areas of the abdomen, such as the  right side or the left lower portion of the abdomen.  You have bloody or black stools, or stools that look like tar.  You have severe pain, cramping, or bloating in your abdomen.  You have signs of dehydration, such as: ? Dark urine, very little urine, or no urine. ? Cracked lips. ? Dry mouth. ? Sunken eyes. ? Sleepiness. ? Weakness. This information is not intended to replace advice given to you by your health care provider. Make sure you discuss any questions you have with your health care provider. Document Released: 11/22/2004 Document Revised: 09/02/2015 Document Reviewed: 07/27/2015 Elsevier Interactive Patient Education  2018 [employer].   IF you received an x-ray today, you will receive an invoice from [facility]. Please contact [facility] at [phone] with questions or concerns regarding your invoice.   IF you received labwork today, you will receive an invoice from [facility]. Please contact [facility] at [phone] with questions or concerns regarding your invoice.   Our billing staff will not be able to assist you with questions regarding bills from these companies.  You will be contacted with the lab results as soon as they are available. The fastest way to get your results is to activate your My Chart account. Instructions are located on the last page of this paperwork. If you have not heard from [place] regarding the results in 2 weeks, please contact this office.    I personally performed the services described in this documentation, which was scribed in my presence. The recorded information has been reviewed and considered for accuracy and completeness, addended by me as needed, and agree with information above.  Signed,   Meredith Staggers, MD [facility] at [facility].  06/16/17 4:17 PM

## 2017-06-16 ENCOUNTER — Encounter: Payer: Self-pay | Admitting: Family Medicine

## 2017-06-16 LAB — COMPREHENSIVE METABOLIC PANEL
ALBUMIN: 4.2 g/dL (ref 3.5–5.5)
ALK PHOS: 57 IU/L (ref 39–117)
ALT: 15 IU/L (ref 0–32)
AST: 15 IU/L (ref 0–40)
Albumin/Globulin Ratio: 1.4 (ref 1.2–2.2)
BUN/Creatinine Ratio: 15 (ref 9–23)
BUN: 10 mg/dL (ref 6–20)
Bilirubin Total: 0.3 mg/dL (ref 0.0–1.2)
CALCIUM: 9.5 mg/dL (ref 8.7–10.2)
CO2: 22 mmol/L (ref 20–29)
CREATININE: 0.67 mg/dL (ref 0.57–1.00)
Chloride: 100 mmol/L (ref 96–106)
GFR calc Af Amer: 134 mL/min/{1.73_m2} (ref >59)
GFR, EST NON AFRICAN AMERICAN: 116 mL/min/{1.73_m2} (ref >59)
GLOBULIN, TOTAL: 3 g/dL (ref 1.5–4.5)
GLUCOSE: 96 mg/dL (ref 65–99)
Potassium: 4.6 mmol/L (ref 3.5–5.2)
SODIUM: 136 mmol/L (ref 134–144)
Total Protein: 7.2 g/dL (ref 6.0–8.5)

## 2017-06-17 ENCOUNTER — Telehealth: Payer: Self-pay

## 2017-06-17 LAB — H. PYLORI BREATH TEST: H pylori Breath Test: NEGATIVE

## 2017-06-17 NOTE — Telephone Encounter (Signed)
PA complete for omeprazole. Pt made aware.

## 2017-06-18 DIAGNOSIS — R197 Diarrhea, unspecified: Secondary | ICD-10-CM | POA: Diagnosis not present

## 2017-06-18 NOTE — Telephone Encounter (Addendum)
Fax from [facility] received on 06/17/17. Prior authorization for omeprazole authorized from 05/18/17 to 06/16/2020. [facility] pharmacy notified, patient notified via MyChart.

## 2017-06-19 LAB — GI PROFILE, STOOL, PCR
Adenovirus F 40/41: NOT DETECTED
Astrovirus: NOT DETECTED
C DIFFICILE TOXIN A/B: NOT DETECTED
CAMPYLOBACTER: NOT DETECTED
Cryptosporidium: NOT DETECTED
Cyclospora cayetanensis: NOT DETECTED
ENTAMOEBA HISTOLYTICA: NOT DETECTED
ENTEROPATHOGENIC E COLI: NOT DETECTED
Enteroaggregative E coli: NOT DETECTED
Enterotoxigenic E coli: NOT DETECTED
Giardia lamblia: NOT DETECTED
NOROVIRUS GI/GII: NOT DETECTED
PLESIOMONAS SHIGELLOIDES: NOT DETECTED
ROTAVIRUS A: NOT DETECTED
SAPOVIRUS: NOT DETECTED
SHIGA-TOXIN-PRODUCING E COLI: NOT DETECTED
SHIGELLA/ENTEROINVASIVE E COLI: NOT DETECTED
Salmonella: NOT DETECTED
VIBRIO CHOLERAE: NOT DETECTED
Vibrio: NOT DETECTED
Yersinia enterocolitica: NOT DETECTED

## 2017-06-27 ENCOUNTER — Other Ambulatory Visit: Payer: Self-pay | Admitting: Gastroenterology

## 2017-06-27 DIAGNOSIS — R140 Abdominal distension (gaseous): Secondary | ICD-10-CM | POA: Diagnosis not present

## 2017-06-27 DIAGNOSIS — R1033 Periumbilical pain: Secondary | ICD-10-CM

## 2017-06-27 DIAGNOSIS — Z8371 Family history of colonic polyps: Secondary | ICD-10-CM | POA: Diagnosis not present

## 2017-06-28 ENCOUNTER — Ambulatory Visit
Admission: RE | Admit: 2017-06-28 | Discharge: 2017-06-28 | Disposition: A | Discharge status: Home / Self Care | Payer: BLUE CROSS/BLUE SHIELD | Source: Ambulatory Visit | Attending: Gastroenterology | Admitting: Gastroenterology

## 2017-06-28 DIAGNOSIS — N839 Noninflammatory disorder of ovary, fallopian tube and broad ligament, unspecified: Secondary | ICD-10-CM | POA: Diagnosis not present

## 2017-06-28 DIAGNOSIS — R1033 Periumbilical pain: Secondary | ICD-10-CM

## 2017-06-28 MED ORDER — IOPAMIDOL (ISOVUE-300) INJECTION 61%
100.0000 mL | Freq: Once | INTRAVENOUS | Status: AC | PRN
  Administered 2017-06-28: 100 mL via INTRAVENOUS

## 2017-07-01 ENCOUNTER — Other Ambulatory Visit: Payer: Self-pay | Admitting: Gastroenterology

## 2017-07-01 DIAGNOSIS — R1011 Right upper quadrant pain: Secondary | ICD-10-CM

## 2017-12-18 ENCOUNTER — Ambulatory Visit: Payer: Self-pay | Admitting: *Deleted

## 2017-12-18 ENCOUNTER — Other Ambulatory Visit: Payer: Self-pay

## 2017-12-18 ENCOUNTER — Encounter: Payer: Self-pay | Admitting: Family Medicine

## 2017-12-18 ENCOUNTER — Ambulatory Visit: Payer: BLUE CROSS/BLUE SHIELD | Admitting: Family Medicine

## 2017-12-18 VITALS — BP 121/76 | HR 87 | Temp 98.7°F | Resp 18 | Ht 61.42 in | Wt 119.2 lb

## 2017-12-18 DIAGNOSIS — R1031 Right lower quadrant pain: Secondary | ICD-10-CM

## 2017-12-18 DIAGNOSIS — M545 Low back pain, unspecified: Secondary | ICD-10-CM

## 2017-12-18 DIAGNOSIS — Z23 Encounter for immunization: Secondary | ICD-10-CM

## 2017-12-18 DIAGNOSIS — R112 Nausea with vomiting, unspecified: Secondary | ICD-10-CM | POA: Diagnosis not present

## 2017-12-18 DIAGNOSIS — R109 Unspecified abdominal pain: Secondary | ICD-10-CM

## 2017-12-18 DIAGNOSIS — R509 Fever, unspecified: Secondary | ICD-10-CM

## 2017-12-18 LAB — POC MICROSCOPIC URINALYSIS (UMFC): MUCUS RE: ABSENT

## 2017-12-18 LAB — POCT CBC
Granulocyte percent: 79.2 %G (ref 37–80)
HEMATOCRIT: 41.8 % — AB (ref 29–41)
HEMOGLOBIN: 13.9 g/dL — AB (ref 9.5–13.5)
Lymph, poc: 1.5 (ref 0.6–3.4)
MCH, POC: 29.7 pg (ref 27–31.2)
MCHC: 33.3 g/dL (ref 31.8–35.4)
MCV: 89.2 fL (ref 76–111)
MID (cbc): 0.2 (ref 0–0.9)
MPV: 8.3 fL (ref 0–99.8)
PLATELET COUNT, POC: 350 10*3/uL (ref 142–424)
POC Granulocyte: 6.3 (ref 2–6.9)
POC LYMPH PERCENT: 18.7 %L (ref 10–50)
POC MID %: 2.1 % (ref 0–12)
RBC: 4.69 M/uL (ref 4.04–5.48)
RDW, POC: 13.2 %
WBC: 7.9 10*3/uL (ref 4.6–10.2)

## 2017-12-18 LAB — POCT URINALYSIS DIP (MANUAL ENTRY)
BILIRUBIN UA: NEGATIVE
BILIRUBIN UA: NEGATIVE mg/dL
Blood, UA: NEGATIVE
GLUCOSE UA: NEGATIVE mg/dL
LEUKOCYTES UA: NEGATIVE
NITRITE UA: NEGATIVE
Protein Ur, POC: NEGATIVE mg/dL
Spec Grav, UA: 1.015 (ref 1.010–1.025)
Urobilinogen, UA: 0.2 E.U./dL
pH, UA: 5.5 (ref 5.0–8.0)

## 2017-12-18 LAB — POC INFLUENZA A&B (BINAX/QUICKVUE)
Influenza A, POC: NEGATIVE
Influenza B, POC: NEGATIVE

## 2017-12-18 NOTE — Telephone Encounter (Signed)
Pt is being seen today at 3:40 pm. Dgaddy, CMA

## 2017-12-18 NOTE — Progress Notes (Signed)
Subjective:  By signing my name below, I, Stann Ore, attest that this documentation has been prepared under the direction and in the presence of Meredith Staggers, MD. Electronically Signed: Stann Ore, Scribe. 12/18/2017 , 4:31 PM .  Patient was seen in Room 10 .   Patient ID: Sarah Haynes, female    DOB: April 17, 1985, 32 y.o.   MRN: [id number] Chief Complaint  Patient presents with  . Abdominal Pain    X 1 day    HPI Sarah Haynes is a 32 y.o. female  Here for abdominal pain; last seen in April. She had reported history of PUD. She was treated with carafate and prilosec at that time. She's followed by Dr. Loreta Ave. When seen in May 2nd, she was advised to start IB guard. Advised on avoidance of NSAIDs. Abdomen CT done on May 3rd, no acute findings, possible 3.4 cm fibroid on the left uterine body.   Patient states she woke up at 3:30AM this morning with cramping low back pain with shivering. She threw up once, and measured a fever of 101.9. She took ibuprofen. She denies any blood in her vomit. Afterwards, her pain radiated over her right flank and into her lower abdomen with intermittent cramps. She still has some nausea. She denies hematuria, urgency, frequency or dysuria. She denies history of kidney stones. She denies receiving flu shot this year. She has had sick contact with people in her work office with strep and cold symptoms. She denies any new rash or tick bites. She's only been able to eat a pear today.   Patient Active Problem List   Diagnosis Date Noted  . Nondisplaced fracture of distal phalanx of right great toe, initial encounter for closed fracture 01/02/2016   Past Medical History:  Diagnosis Date  . Anemia   . Depression    Past Surgical History:  Procedure Laterality Date  . bilateral symphatomy  12/07/2010   No Known Allergies Prior to Admission medications   Medication Sig Start Date End Date Taking? Authorizing Provider  Norgestimate-Ethinyl Estradiol  Triphasic (ORTHO TRI-CYCLEN LO) 0.18/0.215/0.25 MG-25 MCG tab Ortho Tri-Cyclen LO (28) 0.18 mg/0.215 mg/0.25 mg-25 mcg tablet  Take 1 tablet every day by oral route.   Yes [provider]  omeprazole (PRILOSEC) 20 MG capsule Take 1 capsule (20 mg total) by mouth 2 (two) times daily before a meal. Patient not taking: Reported on 12/18/2017 06/15/17   Shade Flood, MD  ranitidine (ZANTAC) 150 MG tablet Take 1 tablet (150 mg total) by mouth 2 (two) times daily. Patient not taking: Reported on 12/18/2017 05/16/16   Meredith Pel, NP  sucralfate (CARAFATE) 1 GM/10ML suspension Take 10 mLs (1 g total) by mouth 4 (four) times daily -  with meals and at bedtime. Patient not taking: Reported on 12/18/2017 06/15/17   Shade Flood, MD   Social History   Socioeconomic History  . Marital status: Single    Spouse name: Not on file  . Number of children: 0  . Years of education: Not on file  . Highest education level: Not on file  Occupational History  . Not on file  Social Needs  . Financial resource strain: Not on file  . Food insecurity:    Worry: Not on file    Inability: Not on file  . Transportation needs:    Medical: Not on file    Non-medical: Not on file  Tobacco Use  . Smoking status: Never Smoker  . Smokeless tobacco: Never  Used  Substance and Sexual Activity  . Alcohol use: Yes    Comment: occ  . Drug use: No  . Sexual activity: Yes    Birth control/protection: Pill  Lifestyle  . Physical activity:    Days per week: Not on file    Minutes per session: Not on file  . Stress: Not on file  Relationships  . Social connections:    Talks on phone: Not on file    Gets together: Not on file    Attends religious service: Not on file    Active member of club or organization: Not on file    Attends meetings of clubs or organizations: Not on file    Relationship status: Not on file  . Intimate partner violence:    Fear of current or ex partner: Not on file     Emotionally abused: Not on file    Physically abused: Not on file    Forced sexual activity: Not on file  Other Topics Concern  . Not on file  Social History Narrative  . Not on file   Review of Systems  Constitutional: Positive for activity change, appetite change, chills, fatigue and fever. Negative for unexpected weight change.  Respiratory: Negative for cough.   Gastrointestinal: Positive for abdominal pain, nausea and vomiting. Negative for constipation and diarrhea.  Genitourinary: Negative for dysuria, frequency, hematuria and urgency.  Musculoskeletal: Positive for back pain.  Skin: Negative for rash and wound.  Neurological: Negative for dizziness, weakness and headaches.       Objective:   Physical Exam  Constitutional: She is oriented to person, place, and time. She appears well-developed and well-nourished. No distress.  HENT:  Head: Normocephalic and atraumatic.  Eyes: Pupils are equal, round, and reactive to light. EOM are normal.  Neck: Neck supple.  Cardiovascular: Regular rhythm. Tachycardia present.  No murmur heard. Pulmonary/Chest: Effort normal. No respiratory distress.  Abdominal: There is tenderness. There is tenderness at McBurney's point.  Tender over mcburney's point, and umbilicus  Musculoskeletal: Normal range of motion.  Neurological: She is alert and oriented to person, place, and time.  Skin: Skin is warm and dry.  Psychiatric: She has a normal mood and affect. Her behavior is normal.  Nursing note and vitals reviewed.   Vitals:   12/18/17 1604  BP: 121/76  Pulse: 87  Resp: 18  Temp: 98.7 F (37.1 C)  TempSrc: Oral  SpO2: 99%  Weight: 119 lb 3.2 oz (54.1 kg)  Height: 5' 1.42" (1.56 m)   Results for orders placed or performed in visit on 12/18/17  POCT Microscopic Urinalysis ([facility])  Result Value Ref Range   WBC,UR,HPF,POC None None WBC/hpf   RBC,UR,HPF,POC None None RBC/hpf   Bacteria Few (A) None, Too numerous to count   Mucus  Absent Absent   Epithelial Cells, UR Per Microscopy Few (A) None, Too numerous to count cells/hpf  POCT urinalysis dipstick  Result Value Ref Range   Color, UA yellow yellow   Clarity, UA clear clear   Glucose, UA negative negative mg/dL   Bilirubin, UA negative negative   Ketones, POC UA negative negative mg/dL   Spec Grav, UA 8.119 1.478 - 1.025   Blood, UA negative negative   pH, UA 5.5 5.0 - 8.0   Protein Ur, POC negative negative mg/dL   Urobilinogen, UA 0.2 0.2 or 1.0 E.U./dL   Nitrite, UA Negative Negative   Leukocytes, UA Negative Negative  POCT CBC  Result Value Ref Range  WBC 7.9 4.6 - 10.2 K/uL   Lymph, poc 1.5 0.6 - 3.4   POC LYMPH PERCENT 18.7 10 - 50 %L   MID (cbc) 0.2 0 - 0.9   POC MID % 2.1 0 - 12 %M   POC Granulocyte 6.3 2 - 6.9   Granulocyte percent 79.2 37 - 80 %G   RBC 4.69 4.04 - 5.48 M/uL   Hemoglobin 13.9 (A) 9.5 - 13.5 g/dL   HCT, POC 16.1 (A) 29 - 41 %   MCV 89.2 76 - 111 fL   MCH, POC 29.7 27 - 31.2 pg   MCHC 33.3 31.8 - 35.4 g/dL   RDW, POC 09.6 %   Platelet Count, POC 350 142 - 424 K/uL   MPV 8.3 0 - 99.8 fL  POC Influenza A&B(BINAX/QUICKVUE)  Result Value Ref Range   Influenza A, POC Negative Negative   Influenza B, POC Negative Negative       Assessment & Plan:  Sarah Haynes is a 32 y.o. female Abdominal pain, unspecified abdominal location - Plan: POCT Microscopic Urinalysis ([facility]), POCT urinalysis dipstick  Fever, unspecified - Plan: POC Influenza A&B(BINAX/QUICKVUE)  RLQ abdominal pain - Plan: POCT CBC, POC Influenza A&B(BINAX/QUICKVUE)  Non-intractable vomiting with nausea, unspecified vomiting type  Acute bilateral low back pain without sciatica - Plan: POC Influenza A&B(BINAX/QUICKVUE)  Acute onset of fever early this morning with back pain, radiating to abdomen, now right lower quadrant abdominal pain.  Reassuring urinalysis, afebrile in office, and no current leukocytosis.  Intermittent pain since this morning.  Differential  includes early appendicitis, but less likely at this time.  Sick contacts at work.   -Symptomatic care with relative rest, fluids, antipyretics as needed and recheck in the next 24 hours for repeat exam and  possible repeat blood count at that time to determine if further imaging needed.    - ER precautions given if worse overnight.   No orders of the defined types were placed in this encounter.  Patient Instructions    Blood count is normal as well as flu test and urine testing today.  Small sips of fluids frequently, avoid solid foods for now, recheck tomorrow for repeat exam and possible repeat blood count.  If any worsening of pain overnight, proceed to emergency room.  If you have lab work done today you will be contacted with your lab results within the next 2 weeks.  If you have not heard from [place] then please contact us. The fastest way to get your results is to register for My Chart.   IF you received an x-ray today, you will receive an invoice from [facility]. Please contact [facility] at [phone] with questions or concerns regarding your invoice.   IF you received labwork today, you will receive an invoice from [place]. Please contact [facility] at [phone] with questions or concerns regarding your invoice.   Our billing staff will not be able to assist you with questions regarding bills from these companies.  You will be contacted with the lab results as soon as they are available. The fastest way to get your results is to activate your My Chart account. Instructions are located on the last page of this paperwork. If you have not heard from [place] regarding the results in 2 weeks, please contact this office.      I personally performed the services described in this documentation, which was scribed in my presence. The recorded information has been reviewed and considered for accuracy and completeness,  addended by me as needed, and agree with information  above.  Signed,   Meredith Staggers, MD [facility] at [facility].  12/18/17 5:01 PM

## 2017-12-18 NOTE — Telephone Encounter (Signed)
Patient woke with cramping in lower back- pain in abdomin and shaking. Patient had fever- 101.9. Patient had vomiting. This patient reports temp- 99.5 and abdominal pain down low. Patient has history of ovarian cyst.  Patient states she wants to see her PCP and he has appointment this afternoon. Patient given strict instructions if she has any changes before her appointment- she should go into ED- she voices understanding.  Reason for Disposition . [1] MILD-MODERATE pain AND [2] constant AND [3] present > 2 hours  Answer Assessment - Initial Assessment Questions 1. LOCATION: "Where does it hurt?"      To the right of the belly button- lower than belly button 2. RADIATION: "Does the pain shoot anywhere else?" (e.g., chest, back)     Not now- cramping in back earlier 3. ONSET: "When did the pain begin?" (e.g., minutes, hours or days ago)      Last night 4. SUDDEN: "Gradual or sudden onset?"     Woke- sudden onset 5. PATTERN "Does the pain come and go, or is it constant?"    - If constant: "Is it getting better, staying the same, or worsening?"      (Note: Constant means the pain never goes away completely; most serious pain is constant and it progresses)     - If intermittent: "How long does it last?" "Do you have pain now?"     (Note: Intermittent means the pain goes away completely between bouts)     Constant- not as severe as when it started 6. SEVERITY: "How bad is the pain?"  (e.g., Scale 1-10; mild, moderate, or severe)   - MILD (1-3): doesn't interfere with normal activities, abdomen soft and not tender to touch    - MODERATE (4-7): interferes with normal activities or awakens from sleep, tender to touch    - SEVERE (8-10): excruciating pain, doubled over, unable to do any normal activities      4 7. RECURRENT SYMPTOM: "Have you ever had this type of abdominal pain before?" If so, ask: "When was the last time?" and "What happened that time?"      Feels like she has earlier in the  year- but she did not have fever. Patient was referred to OB/GYN- possible cyst 8. CAUSE: "What do you think is causing the abdominal pain?"     unknown 9. RELIEVING/AGGRAVATING FACTORS: "What makes it better or worse?" (e.g., movement, antacids, bowel movement)     Took Ibuprofen  10. OTHER SYMPTOMS: "Has there been any vomiting, diarrhea, constipation, or urine problems?"       Vomiting- once, fever, bloating,gas 11. PREGNANCY: "Is there any chance you are pregnant?" "When was your last menstrual period?"       LMP- 10/18  Protocols used: ABDOMINAL PAIN - [facility]

## 2017-12-18 NOTE — Patient Instructions (Addendum)
  Blood count is normal as well as flu test and urine testing today.  Small sips of fluids frequently, avoid solid foods for now, recheck tomorrow for repeat exam and possible repeat blood count.  If any worsening of pain overnight, proceed to emergency room.  If you have lab work done today you will be contacted with your lab results within the next 2 weeks.  If you have not heard from [place] then please contact us. The fastest way to get your results is to register for My Chart.   IF you received an x-ray today, you will receive an invoice from [facility]. Please contact [facility] at [phone] with questions or concerns regarding your invoice.   IF you received labwork today, you will receive an invoice from [place]. Please contact [facility] at [phone] with questions or concerns regarding your invoice.   Our billing staff will not be able to assist you with questions regarding bills from these companies.  You will be contacted with the lab results as soon as they are available. The fastest way to get your results is to activate your My Chart account. Instructions are located on the last page of this paperwork. If you have not heard from [place] regarding the results in 2 weeks, please contact this office.

## 2017-12-19 ENCOUNTER — Other Ambulatory Visit: Payer: Self-pay

## 2017-12-19 ENCOUNTER — Encounter: Payer: Self-pay | Admitting: Physician Assistant

## 2017-12-19 ENCOUNTER — Ambulatory Visit: Payer: BLUE CROSS/BLUE SHIELD | Admitting: Physician Assistant

## 2017-12-19 VITALS — BP 105/69 | HR 87 | Temp 98.5°F | Resp 16 | Ht 61.0 in | Wt 120.6 lb

## 2017-12-19 DIAGNOSIS — R1031 Right lower quadrant pain: Secondary | ICD-10-CM | POA: Diagnosis not present

## 2017-12-19 DIAGNOSIS — G8929 Other chronic pain: Secondary | ICD-10-CM | POA: Diagnosis not present

## 2017-12-19 DIAGNOSIS — Z23 Encounter for immunization: Secondary | ICD-10-CM | POA: Diagnosis not present

## 2017-12-19 LAB — POCT CBC
Granulocyte percent: 63.8 %G (ref 37–80)
HCT, POC: 37.5 % (ref 29–41)
Hemoglobin: 12.7 g/dL (ref 9.5–13.5)
Lymph, poc: 1.9 (ref 0.6–3.4)
MCH, POC: 30.1 pg (ref 27–31.2)
MCHC: 33.9 g/dL (ref 31.8–35.4)
MCV: 88.6 fL (ref 76–111)
MID (cbc): 0.4 (ref 0–0.9)
MPV: 8.3 fL (ref 0–99.8)
POC Granulocyte: 4 (ref 2–6.9)
POC LYMPH PERCENT: 30 %L (ref 10–50)
POC MID %: 6.2 %M (ref 0–12)
Platelet Count, POC: 306 10*3/uL (ref 142–424)
RBC: 4.23 M/uL (ref 4.04–5.48)
RDW, POC: 13.7 %
WBC: 6.3 10*3/uL (ref 4.6–10.2)

## 2017-12-19 LAB — POCT URINALYSIS DIP (MANUAL ENTRY)
Bilirubin, UA: NEGATIVE
Blood, UA: NEGATIVE
Glucose, UA: NEGATIVE mg/dL
Ketones, POC UA: NEGATIVE mg/dL
Leukocytes, UA: NEGATIVE
Nitrite, UA: NEGATIVE
Protein Ur, POC: NEGATIVE mg/dL
Spec Grav, UA: <=1.005 — AB (ref 1.010–1.025)
Urobilinogen, UA: 0.2 U/dL
pH, UA: 6 (ref 5.0–8.0)

## 2017-12-19 LAB — POC MICROSCOPIC URINALYSIS (UMFC): Mucus: ABSENT

## 2017-12-19 NOTE — Progress Notes (Signed)
Sarah Haynes  MRN: [id number] DOB: [birth year]  PCP: Shade Flood, MD  Subjective:  Pt is a 32 year old female who presents to clinic for f/u RLQ pain. She was here yesterday and saw Dr. Neva Seat after she awoke from her sleep with acute fever, RLQ pain and vomiting. CBC, rapid flu and UA negative.  DDx early appy vs gastroenteritis. Advised supportive care with bland diet. She was advised to f/u today.  She does have sick contacts at work.    Today she is feeling better. Still c/o "sharp" pain of RLQ that comes and goes.  She has reduced appetite - not eating much, falafel today. Is staying well hydrated.   Review of Systems  Constitutional: Positive for appetite change (decreased). Negative for chills, diaphoresis, fatigue and fever.  Gastrointestinal: Positive for abdominal pain. Negative for constipation, diarrhea, nausea and vomiting.    Patient Active Problem List   Diagnosis Date Noted  . Nondisplaced fracture of distal phalanx of right great toe, initial encounter for closed fracture 01/02/2016    Current Outpatient Medications on File Prior to Visit  Medication Sig Dispense Refill  . Norgestimate-Ethinyl Estradiol Triphasic (ORTHO TRI-CYCLEN LO) 0.18/0.215/0.25 MG-25 MCG tab Ortho Tri-Cyclen LO (28) 0.18 mg/0.215 mg/0.25 mg-25 mcg tablet  Take 1 tablet every day by oral route.    Marland Kitchen omeprazole (PRILOSEC) 20 MG capsule Take 1 capsule (20 mg total) by mouth 2 (two) times daily before a meal. (Patient not taking: Reported on 12/18/2017) 60 capsule 1  . ranitidine (ZANTAC) 150 MG tablet Take 1 tablet (150 mg total) by mouth 2 (two) times daily. (Patient not taking: Reported on 12/18/2017) 60 tablet 3  . sucralfate (CARAFATE) 1 GM/10ML suspension Take 10 mLs (1 g total) by mouth 4 (four) times daily -  with meals and at bedtime. (Patient not taking: Reported on 12/18/2017) 420 mL 0   No current facility-administered medications on file prior to visit.     No Known Allergies   Objective:  BP 105/69 (BP Location: Right Arm, Patient Position: Sitting, Cuff Size: Normal)   Pulse 87   Temp 98.5 F (36.9 C) (Oral)   Resp 16   Ht 5\' 1"  (1.549 m)   Wt 120 lb 9.6 oz (54.7 kg)   LMP 12/13/2017 (Exact Date)   SpO2 100%   BMI 22.79 kg/m   Physical Exam  Constitutional: She appears well-developed and well-nourished.  Non-toxic appearance. She does not appear ill. No distress.  Abdominal: Soft. Normal appearance. There is no hepatosplenomegaly. There is no tenderness. There is no tenderness at McBurney's point and negative Murphy's sign.  Vitals reviewed.  Results for orders placed or performed in visit on 12/19/17  POCT CBC  Result Value Ref Range   WBC 6.3 4.6 - 10.2 K/uL   Lymph, poc 1.9 0.6 - 3.4   POC LYMPH PERCENT 30.0 10 - 50 %L   MID (cbc) 0.4 0 - 0.9   POC MID % 6.2 0 - 12 %M   POC Granulocyte 4.0 2 - 6.9   Granulocyte percent 63.8 37 - 80 %G   RBC 4.23 4.04 - 5.48 M/uL   Hemoglobin 12.7 9.5 - 13.5 g/dL   HCT, POC 40.9 29 - 41 %   MCV 88.6 76 - 111 fL   MCH, POC 30.1 27 - 31.2 pg   MCHC 33.9 31.8 - 35.4 g/dL   RDW, POC 81.1 %   Platelet Count, POC 306 142 - 424 K/uL   MPV  8.3 0 - 99.8 fL  POCT urinalysis dipstick  Result Value Ref Range   Color, UA yellow yellow   Clarity, UA clear clear   Glucose, UA negative negative mg/dL   Bilirubin, UA negative negative   Ketones, POC UA negative negative mg/dL   Spec Grav, UA <=1.610 (A) 1.010 - 1.025   Blood, UA negative negative   pH, UA 6.0 5.0 - 8.0   Protein Ur, POC negative negative mg/dL   Urobilinogen, UA 0.2 0.2 or 1.0 E.U./dL   Nitrite, UA Negative Negative   Leukocytes, UA Negative Negative  POCT Microscopic Urinalysis ([facility])  Result Value Ref Range   WBC,UR,HPF,POC None None WBC/hpf   RBC,UR,HPF,POC None None RBC/hpf   Bacteria None None, Too numerous to count   Mucus Absent Absent   Epithelial Cells, UR Per Microscopy None None, Too numerous to count cells/hpf    Assessment and  Plan :  1. Right lower quadrant abdominal pain - Pt here for f/u RLQ abdominal pain. She was evaluated yesterday by Dr. Neva Seat and advised close f/u for possible appy. Today she is in NAD. WBC count wnl. Clinically she is improving. Has been sticking to bland diet. Suspect possible viral gastroenteritis. Advised con't bland diet x 1 week or until symptoms resolve. RTC if symptoms worsen.  - POCT CBC - POCT urinalysis dipstick - POCT Microscopic Urinalysis ([facility])   Marco Collie, PA-C  Primary [facility] at [facility] 12/19/2017 4:58 PM  Please note: Portions of this report may have been transcribed using dragon voice recognition software. Every effort was made to ensure accuracy; however, inadvertent computerized transcription errors may be present.

## 2017-12-19 NOTE — Patient Instructions (Addendum)
  Stick to the bland diet until your symptoms improve. Come back if your symptoms return   Bland Diet A bland diet consists of foods that do not have a lot of fat or fiber. Foods without fat or fiber are easier for the body to digest. They are also less likely to irritate your mouth, throat, stomach, and other parts of your gastrointestinal tract. A bland diet is sometimes called a BRAT diet. What is my plan? Your health care provider or dietitian may recommend specific changes to your diet to prevent and treat your symptoms, such as:  Eating small meals often.  Cooking food until it is soft enough to chew easily.  Chewing your food well.  Drinking fluids slowly.  Not eating foods that are very spicy, sour, or fatty.  Not eating citrus fruits, such as oranges and grapefruit.  What do I need to know about this diet?  Eat a variety of foods from the bland diet food list.  Do not follow a bland diet longer than you have to.  Ask your health care provider whether you should take vitamins. What foods can I eat? Grains  Hot cereals, such as cream of wheat. Bread, crackers, or tortillas made from refined white flour. Rice. Vegetables Canned or cooked vegetables. Mashed or boiled potatoes. Fruits Bananas. Applesauce. Other types of cooked or canned fruit with the skin and seeds removed, such as canned peaches or pears. Meats and Other Protein Sources Scrambled eggs. Creamy peanut butter or other nut butters. Lean, well-cooked meats, such as chicken or fish. Tofu. Soups or broths. Dairy Low-fat dairy products, such as milk, cottage cheese, or yogurt. Beverages Water. Herbal tea. Apple juice. Sweets and Desserts Pudding. Custard. Fruit gelatin. Ice cream. Fats and Oils Mild salad dressings. Canola or olive oil. The items listed above may not be a complete list of allowed foods or beverages. Contact your dietitian for more options. What foods are not recommended? Foods and  ingredients that are often not recommended include:  Spicy foods, such as hot sauce or salsa.  Fried foods.  Sour foods, such as pickled or fermented foods.  Raw vegetables or fruits, especially citrus or berries.  Caffeinated drinks.  Alcohol.  Strongly flavored seasonings or condiments.  The items listed above may not be a complete list of foods and beverages that are not allowed. Contact your dietitian for more information. This information is not intended to replace advice given to you by your health care provider. Make sure you discuss any questions you have with your health care provider. Document Released: 06/06/2015 Document Revised: 07/21/2015 Document Reviewed: 02/24/2014 Elsevier Interactive Patient Education  2018 [employer].   IF you received an x-ray today, you will receive an invoice from [facility]. Please contact [facility] at [phone] with questions or concerns regarding your invoice.   IF you received labwork today, you will receive an invoice from [place]. Please contact [facility] at [phone] with questions or concerns regarding your invoice.   Our billing staff will not be able to assist you with questions regarding bills from these companies.  You will be contacted with the lab results as soon as they are available. The fastest way to get your results is to activate your My Chart account. Instructions are located on the last page of this paperwork. If you have not heard from [place] regarding the results in 2 weeks, please contact this office.

## 2018-02-18 DIAGNOSIS — N3001 Acute cystitis with hematuria: Secondary | ICD-10-CM | POA: Diagnosis not present

## 2018-02-18 DIAGNOSIS — R300 Dysuria: Secondary | ICD-10-CM | POA: Diagnosis not present

## 2018-04-22 DIAGNOSIS — H16141 Punctate keratitis, right eye: Secondary | ICD-10-CM | POA: Diagnosis not present

## 2018-05-09 DIAGNOSIS — Z01419 Encounter for gynecological examination (general) (routine) without abnormal findings: Secondary | ICD-10-CM | POA: Diagnosis not present

## 2018-05-09 DIAGNOSIS — N368 Other specified disorders of urethra: Secondary | ICD-10-CM | POA: Diagnosis not present

## 2018-05-09 DIAGNOSIS — Z113 Encounter for screening for infections with a predominantly sexual mode of transmission: Secondary | ICD-10-CM | POA: Diagnosis not present

## 2018-05-09 DIAGNOSIS — Z6821 Body mass index (BMI) 21.0-21.9, adult: Secondary | ICD-10-CM | POA: Diagnosis not present

## 2018-05-30 DIAGNOSIS — H5213 Myopia, bilateral: Secondary | ICD-10-CM | POA: Diagnosis not present

## 2018-05-30 DIAGNOSIS — H43393 Other vitreous opacities, bilateral: Secondary | ICD-10-CM | POA: Diagnosis not present

## 2018-07-23 DIAGNOSIS — H43391 Other vitreous opacities, right eye: Secondary | ICD-10-CM | POA: Diagnosis not present

## 2018-07-23 DIAGNOSIS — H442E3 Degenerative myopia with other maculopathy, bilateral eye: Secondary | ICD-10-CM | POA: Diagnosis not present

## 2018-07-23 DIAGNOSIS — H15833 Staphyloma posticum, bilateral: Secondary | ICD-10-CM | POA: Diagnosis not present

## 2018-09-18 ENCOUNTER — Encounter: Payer: Self-pay | Admitting: Family Medicine

## 2018-09-18 ENCOUNTER — Other Ambulatory Visit: Payer: Self-pay

## 2018-09-18 ENCOUNTER — Ambulatory Visit (INDEPENDENT_AMBULATORY_CARE_PROVIDER_SITE_OTHER): Payer: BC Managed Care – PPO

## 2018-09-18 ENCOUNTER — Ambulatory Visit: Payer: BC Managed Care – PPO | Admitting: Family Medicine

## 2018-09-18 VITALS — BP 111/74 | HR 100 | Temp 98.6°F | Ht 61.0 in | Wt 117.8 lb

## 2018-09-18 DIAGNOSIS — M25521 Pain in right elbow: Secondary | ICD-10-CM

## 2018-09-18 DIAGNOSIS — G5621 Lesion of ulnar nerve, right upper limb: Secondary | ICD-10-CM

## 2018-09-18 DIAGNOSIS — S59901A Unspecified injury of right elbow, initial encounter: Secondary | ICD-10-CM | POA: Diagnosis not present

## 2018-09-18 DIAGNOSIS — M21511 Acquired clawhand, right hand: Secondary | ICD-10-CM | POA: Diagnosis not present

## 2018-09-18 MED ORDER — TRAMADOL HCL 50 MG PO TABS: 50.0000 mg | ORAL_TABLET | Freq: Three times a day (TID) | ORAL | 0 refills | Status: AC | PRN

## 2018-09-18 MED ORDER — PREDNISONE 10 MG PO TABS: ORAL_TABLET | ORAL | 0 refills | Status: AC

## 2018-09-18 NOTE — Patient Instructions (Addendum)
If you have lab work done today you will be contacted with your lab results within the next 2 weeks.  If you have not heard from [place] then please contact us. The fastest way to get your results is to register for My Chart.   IF you received an x-ray today, you will receive an invoice from [facility]. Please contact [facility] at [phone] with questions or concerns regarding your invoice.   IF you received labwork today, you will receive an invoice from [place]. Please contact [facility] at [phone] with questions or concerns regarding your invoice.   Our billing staff will not be able to assist you with questions regarding bills from these companies.  You will be contacted with the lab results as soon as they are available. The fastest way to get your results is to activate your My Chart account. Instructions are located on the last page of this paperwork. If you have not heard from [place] regarding the results in 2 weeks, please contact this office.      Ulnar Nerve Contusion  The ulnar nerve extends from the shoulder to the small finger (pinkie) of the hand. This nerve provides feeling (sensation) to the pinkie and half of the ring finger. It also controls many hand and forearm muscles that let you grip objects. An ulnar nerve contusion is a bruise of the ulnar nerve at a point close to the elbow. An ulnar nerve contusion can cause a loss of feeling or movement in your hand. It can also affect your ability to use the muscles that allow you to grip objects. What are the causes? This condition may be caused by:  A hit to the elbow.  Falling on your elbow.  Shoving your elbow against a hard surface. What increases the risk? This condition is more likely to develop in people who:  Play contact sports, like football.  Have a disorder that increases the risk of bleeding.  Take blood thinning medicine, such as warfarin. What are the signs or  symptoms? Symptoms of this condition include:  Tingling or numbness in the hand, especially in the pinkie or ring finger.  Weakness while moving the hand from side to side in a waving motion or while moving your fingers together.  Abnormal claw-like hand position or deformity. How is this diagnosed? This condition may be diagnosed based on your symptoms, your medical history, and a physical exam. You may have tests, such as:  An X-ray to check for broken bones.  An electromyogram (EMG) to see how well your nerves are working.  A nerve conduction study (NCS) to see how electrical signals pass through your nerves.  An MRI to find the source of any nerve problems. How is this treated? This condition usually heals on its own within 6 weeks. Treatment can help to reduce symptoms and may include:  Wearing a brace or splint at night to keep your elbow straight while you sleep.  Taking NSAIDs, such as ibuprofen, to reduce pain and swelling around the nerve.  Working with a physical therapist to ease stiffness in your elbow and wrist. Follow these instructions at home: If you have a brace or splint:  Wear it as told by your health care provider. Remove it only as told by your health care provider.  Loosen it if your fingers tingle, become numb, or turn cold and blue.  Keep it clean.  If it is not waterproof: ? Do not let it get wet. ?  Cover it with a watertight covering when you take a bath or shower. Managing pain, stiffness, and swelling   If directed, put ice on the injured area. ? If you have a removable brace or splint, remove it as told by your health care provider. ? Put ice in a plastic bag. ? Place a towel between your skin and the bag. ? Leave the ice on for 20 minutes, 2-3 times a day.  Move your fingers often to reduce stiffness and swelling.  Raise (elevate) the injured area above the level of your heart while you are sitting or lying down. Activity  Return to  your normal activities as told by your health care provider. Ask your health care provider what activities are safe for you.  Avoid activities that take a lot of effort (strenuous) for as long as told by your health care provider.  Do exercises as told by your health care provider. General instructions  Do not use the injured limb to support your body weight until your health care provider says that you can.  Do not use any products that contain nicotine or tobacco, such as cigarettes, e-cigarettes, and chewing tobacco. If you need help quitting, ask your health care provider.  Take over-the-counter and prescription medicines only as told by your health care provider.  Keep all follow-up visits as told by your health care provider. This is important. How is this prevented?  Be safe and responsible while being active to avoid falls.  Use protective equipment during sports activities, such as elbow pads. Contact a health care provider if your:  Pain does not improve or it gets worse.  Swelling does not improve or it gets worse.  Grip becomes weaker or you start to drop things easily. Get help right away if you:  Have severe pain.  Have severe swelling.  Cannot move your wrist, hand, or elbow.  Cannot feel parts of your hand, wrist, or arm. Summary  An ulnar nerve contusion is a bruise of the ulnar nerve at a point close to the elbow. The ulnar nerve extends from the shoulder to the small finger (pinkie) of the hand.  This injury may be caused by a hit or a fall on your elbow. You are more likely to get this injury if you play contact sports.  If you have a brace or splint, wear it and remove it as told by your health care provider. Keep it clean and dry.  To help with the symptoms of pain, stiffness, and swelling, put ice on the injured area or take NSAIDs as directed by your health care provider.  Get help right away if you have severe pain or severe swelling, or if you  cannot feel your hand, wrist, or arm. This information is not intended to replace advice given to you by your health care provider. Make sure you discuss any questions you have with your health care provider. Document Released: 02/12/2005 Document Revised: 04/10/2018 Document Reviewed: 04/10/2018 Elsevier Patient Education  2020 [employer]

## 2018-09-18 NOTE — Progress Notes (Signed)
7/23/20203:41 PM  Zamyra Allensworth 16-Oct-1985, 33 y.o., female [id number]  Chief Complaint  Patient presents with  . right elbow pain    door knob hit elbow. 1 week ago     HPI:   Patient is a 33 y.o. female  who presents today for right elbow pain  A week ago while at work she was carrying a box thru a door when the door slammed against her elbow Was getting better than 3-4 days ago started hurting pain Burning cramping radiating down to her hands, numbness of 4th and 5th digits. Hand feels week Has been icing, resting, keeping elbow and wrist straight are most comfortable Works in lab Right handed  She is also requesting a letter for stand up desk at work   Depression screen [facility] 2/9 12/19/2017 12/18/2017 06/15/2017  Decreased Interest 0 0 0  Down, Depressed, Hopeless 0 0 0  PHQ - 2 Score 0 0 0    Fall Risk  09/18/2018 12/19/2017 12/18/2017 06/15/2017 04/22/2017  Falls in the past year? 0 No No No No  Number falls in past yr: 0 - - - -  Injury with Fall? 0 - - - -  Comment - - - - -  Follow up Falls evaluation completed - - - -     No Known Allergies  Prior to Admission medications   Medication Sig Start Date End Date Taking? Authorizing Provider  Norgestimate-Ethinyl Estradiol Triphasic (ORTHO TRI-CYCLEN LO) 0.18/0.215/0.25 MG-25 MCG tab Ortho Tri-Cyclen LO (28) 0.18 mg/0.215 mg/0.25 mg-25 mcg tablet  Take 1 tablet every day by oral route.   Yes [provider]    Past Medical History:  Diagnosis Date  . Anemia   . Depression     Past Surgical History:  Procedure Laterality Date  . bilateral symphatomy  12/07/2010    Social History   Tobacco Use  . Smoking status: Never Smoker  . Smokeless tobacco: Never Used  Substance Use Topics  . Alcohol use: Yes    Comment: occ    Family History  Problem Relation Age of Onset  . Cancer Mother   . Stomach cancer Mother   . Cancer Father   . Cancer Paternal Grandmother     ROS Per hpi  OBJECTIVE:   LOVFI'E PPIRJJ   09/18/18 1524  BP: 111/74  Pulse: 100  Temp: 98.6 F (37 C)  TempSrc: Oral  SpO2: 96%  Weight: 117 lb 12.8 oz (53.4 kg)  Height: 5\' 1"  (1.549 m)   Body mass index is 22.26 kg/m.   Physical Exam Gen; AAOx3, in obvious discomfort, supporting right arm RUE: FROM, non bony tenderness, she has non focal tenderness over flexor surface of forearm. Weak grip, mild clawing of 4th and 5th digits with decreased sensation to light touch. +2 pulses.    Dg Elbow Complete Right (3+view)  Result Date: 09/18/2018 CLINICAL DATA:  Elbow pain with injury, decreased range of motion EXAM: RIGHT ELBOW - COMPLETE 3+ VIEW COMPARISON:  None FINDINGS: Osseous mineralization normal. Joint spaces preserved. No fracture, dislocation, or bone destruction. No joint effusion. IMPRESSION: Normal exam. Electronically Signed   By: Lavonia Dana M.D.   On: 09/18/2018 15:56     ASSESSMENT and PLAN  1. Elbow pain, right 2. Ulnar neuropathy of right upper extremity 3. Claw hand of right upper extremity Discussed with patient most likely contused ulnar nerve. rx per below, reviewed r/se/b. Excused from work for tomorrow and thru the weekend. Referring to sports medicine for  further eval and treatment.  - DG ELBOW COMPLETE RIGHT (3+VIEW) - Ambulatory referral to Sports Medicine  Other orders - predniSONE (DELTASONE) 10 MG tablet; Take 4 tablets (40 mg total) by mouth daily with breakfast for 1 day, THEN 3 tablets (30 mg total) daily with breakfast for 1 day, THEN 2 tablets (20 mg total) daily with breakfast for 1 day, THEN 1 tablet (10 mg total) daily with breakfast for 1 day. - traMADol (ULTRAM) 50 MG tablet; Take 1 tablet (50 mg total) by mouth every 8 (eight) hours as needed for up to 5 days.   Also provided letter for standing desk as in general they are better for people's health.   Return if symptoms worsen or fail to improve.    Myles LippsIrma M Santiago, MD [facility] [address] [place], [place] [address] Ph.  [phone] Fax [phone]

## 2018-09-29 ENCOUNTER — Ambulatory Visit: Payer: Self-pay | Admitting: Family Medicine

## 2018-12-05 ENCOUNTER — Ambulatory Visit: Payer: BC Managed Care – PPO | Admitting: Family Medicine

## 2018-12-05 ENCOUNTER — Other Ambulatory Visit: Payer: Self-pay

## 2018-12-05 ENCOUNTER — Encounter: Payer: Self-pay | Admitting: Family Medicine

## 2018-12-05 VITALS — BP 109/72 | HR 86 | Temp 97.3°F | Resp 16 | Ht 61.0 in | Wt 120.4 lb

## 2018-12-05 DIAGNOSIS — S00412A Abrasion of left ear, initial encounter: Secondary | ICD-10-CM

## 2018-12-05 DIAGNOSIS — L299 Pruritus, unspecified: Secondary | ICD-10-CM

## 2018-12-05 NOTE — Patient Instructions (Addendum)
   Try a little dilute vinegar in the ear when itching, or a little 1% hydrocortisone cream as discussed.  Return if further problems.  If you have lab work done today you will be contacted with your lab results within the next 2 weeks.  If you have not heard from [place] then please contact us. The fastest way to get your results is to register for My Chart.   IF you received an x-ray today, you will receive an invoice from [facility]. Please contact [facility] at [phone] with questions or concerns regarding your invoice.   IF you received labwork today, you will receive an invoice from [place]. Please contact [facility] at [phone] with questions or concerns regarding your invoice.   Our billing staff will not be able to assist you with questions regarding bills from these companies.  You will be contacted with the lab results as soon as they are available. The fastest way to get your results is to activate your My Chart account. Instructions are located on the last page of this paperwork. If you have not heard from [place] regarding the results in 2 weeks, please contact this office.

## 2018-12-05 NOTE — Progress Notes (Signed)
Patient ID: Sarah Haynes, female    DOB: Oct 15, 1985  Age: 33 y.o. MRN: [id number]  Chief Complaint  Patient presents with  . Ear Pain    started on wednesday (x2 days); cleaning ear and saw small amt of blood; pain is getting worse; denies drainage    Subjective:   Patient has been having some itching in her ears.  She wipes them with Q-tips frequently.  A couple of days ago she noticed a little blood.  Her boyfriend looked in there and thought she just scratched things.  It has continued to hurt a little bit so she was concerned and got checked today.  Current allergies, medications, problem list, past/family and social histories reviewed.  Objective:  BP 109/72   Pulse 86   Temp (!) 97.3 F (36.3 C) (Oral)   Resp 16   Ht 5\' 1"  (1.549 m)   Wt 120 lb 6.4 oz (54.6 kg)   SpO2 100%   BMI 22.75 kg/m   No major distress.  Right ear canal and TM are normal.  Left ear canal has a tiny abrasion on the floor of the canal just inside the opening.  The drum looks fine.  Both ears are entirely clean.  Assessment & Plan:   Assessment: 1. Ear canal abrasion, left, initial encounter   2. Itching of ear       Plan: Instructions.  No orders of the defined types were placed in this encounter.   No orders of the defined types were placed in this encounter.        Patient Instructions     Try a little dilute vinegar in the ear when itching, or a little 1% hydrocortisone cream as discussed.  Return if further problems.  If you have lab work done today you will be contacted with your lab results within the next 2 weeks.  If you have not heard from [place] then please contact us. The fastest way to get your results is to register for My Chart.   IF you received an x-ray today, you will receive an invoice from [facility]. Please contact [facility] at [phone] with questions or concerns regarding your invoice.   IF you received labwork today, you will receive  an invoice from [facility]. Please contact [facility] at [phone] with questions or concerns regarding your invoice.   Our billing staff will not be able to assist you with questions regarding bills from these companies.  You will be contacted with the lab results as soon as they are available. The fastest way to get your results is to activate your My Chart account. Instructions are located on the last page of this paperwork. If you have not heard from [place] regarding the results in 2 weeks, please contact this office.         Return if symptoms worsen or fail to improve.   Ruben Reason, MD 12/05/2018

## 2019-03-05 ENCOUNTER — Other Ambulatory Visit: Payer: Self-pay

## 2019-03-05 ENCOUNTER — Encounter: Payer: Self-pay | Admitting: Family Medicine

## 2019-03-05 ENCOUNTER — Ambulatory Visit (INDEPENDENT_AMBULATORY_CARE_PROVIDER_SITE_OTHER): Payer: Self-pay | Admitting: Family Medicine

## 2019-03-05 VITALS — BP 117/82 | HR 84 | Temp 98.0°F | Ht 61.0 in | Wt 120.0 lb

## 2019-03-05 DIAGNOSIS — H9202 Otalgia, left ear: Secondary | ICD-10-CM

## 2019-03-05 MED ORDER — PREDNISONE 20 MG PO TABS: ORAL_TABLET | ORAL | 0 refills | Status: DC

## 2019-03-05 NOTE — Patient Instructions (Addendum)
Your ears appear entirely normal.  Your description sounds like you could have some intermittent obstruction of the eustachian tube making you hear the pulsations in your ear and having the twinges of pain.  We will go ahead and treat with prednisone which is a steroid type medication that has strong anti-inflammatory effects to try and help open up the eustachian tube.  If symptoms continue to persist or if they are getting worse at any time I am happy to have a referral made for you to an ear nose and throat specialist.  Since I am not here all the time, please call the office to the referrals help desk and tell them to look at these instructions in your chart and go ahead and make the referral to the ear nose and throat doctors.  Return as needed    If you have lab work done today you will be contacted with your lab results within the next 2 weeks.  If you have not heard from [place] then please contact us. The fastest way to get your results is to register for My Chart.   IF you received an x-ray today, you will receive an invoice from [facility]. Please contact [facility] at [phone] with questions or concerns regarding your invoice.   IF you received labwork today, you will receive an invoice from [place]. Please contact [facility] at [phone] with questions or concerns regarding your invoice.   Our billing staff will not be able to assist you with questions regarding bills from these companies.  You will be contacted with the lab results as soon as they are available. The fastest way to get your results is to activate your My Chart account. Instructions are located on the last page of this paperwork. If you have not heard from [place] regarding the results in 2 weeks, please contact this office.

## 2019-03-05 NOTE — Progress Notes (Signed)
Patient ID: Rayyan Orsborn, female    DOB: [birth year]  Age: 34 y.o. MRN: [id number]  Chief Complaint  Patient presents with  . Ear Pain    L ear has a thrombing ringing pain. Been going on for about a week now. pt had been using alive to help, but its not helping stated the pt. no discharge and pt hasn't had any trigering exeriances in since this started.    Subjective:   I saw the patient about 3 months ago for an ear problem.  That seemed to be an abrasion of the ear canal then.  She has not been using Q-tips since that time.  Over the past few days she has been having a throbbing in the left ear.  There is sometimes a ringing zing of a noise.  This been mostly in the daytime, not awakening her at night.  Does not have a cold or any other major problems.  Her significant other had Covid a couple of months ago, but she did not get sick.  Current allergies, medications, problem list, past/family and social histories reviewed.  Objective:  BP 117/82   Pulse 84   Temp 98 F (36.7 C) (Temporal)   Ht 5\' 1"  (1.549 m)   Wt 120 lb (54.4 kg)   SpO2 97%   BMI 22.67 kg/m   No acute distress.  Pleasant young lady.  TMs are both entirely normal.  No tenderness of the TMJ.  No tenderness of the neck.  No nodes  Assessment & Plan:   Assessment: 1. Otalgia of left ear       Plan: See instructions  No orders of the defined types were placed in this encounter.   Meds ordered this encounter  Medications  . predniSONE (DELTASONE) 20 MG tablet    Sig: Take 3 daily for 2 days, then 2 daily for 2 days, then 1 daily for 2 days.  Best taken in the morning after breakfast.    Dispense:  12 tablet    Refill:  0         Patient Instructions   Your ears appear entirely normal.  Your description sounds like you could have some intermittent obstruction of the eustachian tube making you hear the pulsations in your ear and having the twinges of pain.  We will go ahead and treat with prednisone  which is a steroid type medication that has strong anti-inflammatory effects to try and help open up the eustachian tube.  If symptoms continue to persist or if they are getting worse at any time I am happy to have a referral made for you to an ear nose and throat specialist.  Since I am not here all the time, please call the office to the referrals help desk and tell them to look at these instructions in your chart and go ahead and make the referral to the ear nose and throat doctors.  Return as needed    If you have lab work done today you will be contacted with your lab results within the next 2 weeks.  If you have not heard from [place] then please contact us. The fastest way to get your results is to register for My Chart.   IF you received an x-ray today, you will receive an invoice from [facility]. Please contact [facility] at [phone] with questions or concerns regarding your invoice.   IF you received labwork today, you will receive an invoice from [employer] [facility]. Please contact [facility] at  [phone] with questions or concerns regarding your invoice.   Our billing staff will not be able to assist you with questions regarding bills from these companies.  You will be contacted with the lab results as soon as they are available. The fastest way to get your results is to activate your My Chart account. Instructions are located on the last page of this paperwork. If you have not heard from [place] regarding the results in 2 weeks, please contact this office.        Return if symptoms worsen or fail to improve.   Janace Hoard, MD 03/05/2019

## 2020-03-28 ENCOUNTER — Ambulatory Visit: Payer: 59 | Admitting: Family Medicine

## 2020-03-28 ENCOUNTER — Encounter: Payer: Self-pay | Admitting: Family Medicine

## 2020-03-28 ENCOUNTER — Other Ambulatory Visit (HOSPITAL_COMMUNITY)
Admission: RE | Admit: 2020-03-28 | Discharge: 2020-03-28 | Disposition: A | Discharge status: Home / Self Care | Payer: 59 | Source: Ambulatory Visit | Attending: Family Medicine | Admitting: Family Medicine

## 2020-03-28 ENCOUNTER — Other Ambulatory Visit: Payer: Self-pay

## 2020-03-28 VITALS — BP 119/79 | HR 92 | Temp 98.3°F | Wt 120.0 lb

## 2020-03-28 DIAGNOSIS — N898 Other specified noninflammatory disorders of vagina: Secondary | ICD-10-CM | POA: Insufficient documentation

## 2020-03-28 DIAGNOSIS — Z113 Encounter for screening for infections with a predominantly sexual mode of transmission: Secondary | ICD-10-CM | POA: Diagnosis not present

## 2020-03-28 DIAGNOSIS — K219 Gastro-esophageal reflux disease without esophagitis: Secondary | ICD-10-CM

## 2020-03-28 LAB — POCT URINALYSIS DIP (MANUAL ENTRY)
Bilirubin, UA: NEGATIVE
Glucose, UA: NEGATIVE mg/dL
Ketones, POC UA: NEGATIVE mg/dL
Leukocytes, UA: NEGATIVE
Nitrite, UA: NEGATIVE
Protein Ur, POC: NEGATIVE mg/dL
Spec Grav, UA: >=1.03 — AB (ref 1.010–1.025)
Urobilinogen, UA: 0.2 E.U./dL
pH, UA: 5.5 (ref 5.0–8.0)

## 2020-03-28 LAB — POCT WET + KOH PREP
Trich by wet prep: ABSENT
Yeast by KOH: ABSENT
Yeast by wet prep: ABSENT

## 2020-03-28 MED ORDER — METRONIDAZOLE 0.75 % VA GEL: 1.0000 | Freq: Every day | VAGINAL | 0 refills | Status: AC

## 2020-03-28 MED ORDER — PANTOPRAZOLE SODIUM 40 MG PO TBEC: 40.0000 mg | DELAYED_RELEASE_TABLET | Freq: Every day | ORAL | 0 refills | Status: DC

## 2020-03-28 NOTE — Patient Instructions (Signed)
Gastroesophageal Reflux Disease, Adult  Gastroesophageal reflux (GER) happens when acid from the stomach flows up into the tube that connects the mouth and the stomach (esophagus). Normally, food travels down the esophagus and stays in the stomach to be digested. With GER, food and stomach acid sometimes move back up into the esophagus. You may have a disease called gastroesophageal reflux disease (GERD) if the reflux:  Happens often.  Causes frequent or very bad symptoms.  Causes problems such as damage to the esophagus. When this happens, the esophagus becomes sore and swollen. Over time, GERD can make small holes (ulcers) in the lining of the esophagus. What are the causes? This condition is caused by a problem with the muscle between the esophagus and the stomach. When this muscle is weak or not normal, it does not close properly to keep food and acid from coming back up from the stomach. The muscle can be weak because of:  Tobacco use.  Pregnancy.  Having a certain type of hernia (hiatal hernia).  Alcohol use.  Certain foods and drinks, such as coffee, chocolate, onions, and peppermint. What increases the risk?  Being overweight.  Having a disease that affects your connective tissue.  Taking NSAIDs, such a ibuprofen. What are the signs or symptoms?  Heartburn.  Difficult or painful swallowing.  The feeling of having a lump in the throat.  A bitter taste in the mouth.  Bad breath.  Having a lot of saliva.  Having an upset or bloated stomach.  Burping.  Chest pain. Different conditions can cause chest pain. Make sure you see your doctor if you have chest pain.  Shortness of breath or wheezing.  A long-term cough or a cough at night.  Wearing away of the surface of teeth (tooth enamel).  Weight loss. How is this treated?  Making changes to your diet.  Taking medicine.  Having surgery. Treatment will depend on how bad your symptoms are. Follow these  instructions at home: Eating and drinking  Follow a diet as told by your doctor. You may need to avoid foods and drinks such as: ? Coffee and tea, with or without caffeine. ? Drinks that contain alcohol. ? Energy drinks and sports drinks. ? Bubbly (carbonated) drinks or sodas. ? Chocolate and cocoa. ? Peppermint and mint flavorings. ? Garlic and onions. ? Horseradish. ? Spicy and acidic foods. These include peppers, chili powder, curry powder, vinegar, hot sauces, and BBQ sauce. ? Citrus fruit juices and citrus fruits, such as oranges, lemons, and limes. ? Tomato-based foods. These include red sauce, chili, salsa, and pizza with red sauce. ? Fried and fatty foods. These include donuts, french fries, potato chips, and high-fat dressings. ? High-fat meats. These include hot dogs, rib eye steak, sausage, ham, and bacon. ? High-fat dairy items, such as whole milk, butter, and cream cheese.  Eat small meals often. Avoid eating large meals.  Avoid drinking large amounts of liquid with your meals.  Avoid eating meals during the 2-3 hours before bedtime.  Avoid lying down right after you eat.  Do not exercise right after you eat.   Lifestyle  Do not smoke or use any products that contain nicotine or tobacco. If you need help quitting, ask your doctor.  Try to lower your stress. If you need help doing this, ask your doctor.  If you are overweight, lose an amount of weight that is healthy for you. Ask your doctor about a safe weight loss goal.   General instructions    Pay attention to any changes in your symptoms.  Take over-the-counter and prescription medicines only as told by your doctor.  Do not take aspirin, ibuprofen, or other NSAIDs unless your doctor says it is okay.  Wear loose clothes. Do not wear anything tight around your waist.  Raise (elevate) the head of your bed about 6 inches (15 cm). You may need to use a wedge to do this.  Avoid bending over if this makes your  symptoms worse.  Keep all follow-up visits. Contact a doctor if:  You have new symptoms.  You lose weight and you do not know why.  You have trouble swallowing or it hurts to swallow.  You have wheezing or a cough that keeps happening.  You have a hoarse voice.  Your symptoms do not get better with treatment. Get help right away if:  You have sudden pain in your arms, neck, jaw, teeth, or back.  You suddenly feel sweaty, dizzy, or light-headed.  You have chest pain or shortness of breath.  You vomit and the vomit is green, yellow, or black, or it looks like blood or coffee grounds.  You faint.  Your poop (stool) is red, bloody, or black.  You cannot swallow, drink, or eat. These symptoms may represent a serious problem that is an emergency. Do not wait to see if the symptoms will go away. Get medical help right away. Call your local emergency services (911 in the [place] Do not drive yourself to the hospital. Summary  If a person has gastroesophageal reflux disease (GERD), food and stomach acid move back up into the esophagus and cause symptoms or problems such as damage to the esophagus.  Treatment will depend on how bad your symptoms are.  Follow a diet as told by your doctor.  Take all medicines only as told by your doctor. This information is not intended to replace advice given to you by your health care provider. Make sure you discuss any questions you have with your health care provider. Document Revised: 08/24/2019 Document Reviewed: 08/24/2019 Elsevier Patient Education  2021 [employer]  

## 2020-03-28 NOTE — Progress Notes (Signed)
1/31/20224:44 PM  Sarah Haynes [birth year], 35 y.o., female [id number]  Chief Complaint  Patient presents with  . possible yeast infection    Experiencing vaginal discharge, has been treated for uti w/ macrobid recently     HPI:   Patient is a 35 y.o. female with no significant past medical history who presents today for vaginal discharge.  1/20 treated for UTI with macrobid Has been using the ring for 6 months Lower abdominal pain Discharge for a month Tested for STIs over a year ago: no new partners since then Treated with monistat last month Discharge is clear to cloudy Notices a smell to it  Pap smear last June/July Done with OB Currently on period When on period lots of bloating    Depression screen [facility] 2/9 03/28/2020 03/05/2019 12/05/2018  Decreased Interest 0 0 0  Down, Depressed, Hopeless 0 0 0  PHQ - 2 Score 0 0 0    Fall Risk  03/28/2020 03/05/2019 09/18/2018 12/19/2017 12/18/2017  Falls in the past year? 0 0 0 No No  Number falls in past yr: 0 0 0 - -  Injury with Fall? 0 0 0 - -  Comment - - - - -  Follow up Falls evaluation completed Falls evaluation completed Falls evaluation completed - -     No Known Allergies  Prior to Admission medications   Medication Sig Start Date End Date Taking? Authorizing Provider  etonogestrel-ethinyl estradiol (NUVARING) 0.12-0.015 MG/24HR vaginal ring NuvaRing 0.12 mg-0.015 mg/24 hr vaginal   Yes [provider]    Past Medical History:  Diagnosis Date  . Anemia   . Depression     Past Surgical History:  Procedure Laterality Date  . bilateral symphatomy  12/07/2010    Social History   Tobacco Use  . Smoking status: Never Smoker  . Smokeless tobacco: Never Used  Substance Use Topics  . Alcohol use: Yes    Comment: occ    Family History  Problem Relation Age of Onset  . Cancer Mother   . Stomach cancer Mother   . Cancer Father   . Cancer Paternal Grandmother     Review of Systems   Constitutional: Negative for chills, fever and malaise/fatigue.  Eyes: Negative for blurred vision and double vision.  Respiratory: Negative for cough, shortness of breath and wheezing.   Cardiovascular: Negative for chest pain, palpitations and leg swelling.  Gastrointestinal: Positive for heartburn. Negative for abdominal pain, blood in stool, constipation, diarrhea, nausea and vomiting.  Genitourinary: Negative for dysuria, flank pain, frequency, hematuria and urgency.  Musculoskeletal: Negative for back pain and joint pain.  Skin: Negative for rash.  Neurological: Negative for dizziness, weakness and headaches.     OBJECTIVE:  Today's Vitals   03/28/20 1644  BP: 119/79  Pulse: 92  Temp: 98.3 F (36.8 C)  SpO2: 99%  Weight: 120 lb (54.4 kg)   Body mass index is 22.67 kg/m.   Physical Exam Constitutional:      General: She is not in acute distress.    Appearance: Normal appearance. She is not ill-appearing.  HENT:     Head: Normocephalic.  Cardiovascular:     Rate and Rhythm: Normal rate and regular rhythm.     Pulses: Normal pulses.     Heart sounds: Normal heart sounds. No murmur heard. No friction rub. No gallop.   Pulmonary:     Effort: Pulmonary effort is normal. No respiratory distress.     Breath sounds: Normal breath sounds. No  stridor. No wheezing, rhonchi or rales.  Abdominal:     General: Bowel sounds are normal. There is no distension.     Palpations: Abdomen is soft. There is no mass.     Tenderness: There is no abdominal tenderness. There is no right CVA tenderness, left CVA tenderness, guarding or rebound.     Hernia: No hernia is present.  Musculoskeletal:     Right lower leg: No edema.     Left lower leg: No edema.  Skin:    General: Skin is warm and dry.  Neurological:     Mental Status: She is alert and oriented to person, place, and time.  Psychiatric:        Mood and Affect: Mood normal.        Behavior: Behavior normal.     Results  for orders placed or performed in visit on 03/28/20 (from the past 24 hour(s))  POCT Wet + KOH Prep     Status: Abnormal   Collection Time: 03/28/20  4:27 PM  Result Value Ref Range   Yeast by KOH Absent Absent   Yeast by wet prep Absent Absent   WBC by wet prep None (A) Few   Clue Cells Wet Prep HPF POC None None   Trich by wet prep Absent Absent   Bacteria Wet Prep HPF POC Moderate (A) Few   Epithelial Cells By [employer] [facility]) Few None, Few, Too numerous to count   RBC,UR,HPF,POC Few (A) None RBC/hpf  POCT urinalysis dipstick     Status: Abnormal   Collection Time: 03/28/20  4:28 PM  Result Value Ref Range   Color, UA yellow yellow   Clarity, UA clear clear   Glucose, UA negative negative mg/dL   Bilirubin, UA negative negative   Ketones, POC UA negative negative mg/dL   Spec Grav, UA >=9.371 (A) 1.010 - 1.025   Blood, UA small (A) negative   pH, UA 5.5 5.0 - 8.0   Protein Ur, POC negative negative mg/dL   Urobilinogen, UA 0.2 0.2 or 1.0 E.U./dL   Nitrite, UA Negative Negative   Leukocytes, UA Negative Negative    No results found.   ASSESSMENT and PLAN  Problem List Items Addressed This Visit   None   Visit Diagnoses    Vaginal discharge    -  Primary   Relevant Medications   metroNIDAZOLE (METROGEL) 0.75 % vaginal gel   Other Relevant Orders   POCT Wet + KOH Prep (Completed)   POCT urinalysis dipstick (Completed)   Urine Culture   Urine cytology ancillary only   Gastroesophageal reflux disease without esophagitis       Relevant Medications   pantoprazole (PROTONIX) 40 MG tablet      Plan . Follow up with Protonix: take daily for 1 month then as needed or transition to OTC famotidine . Will follow up with urine culture and STI testing . Metronidazole gel for 5 days qhs    Return in about 3 months (around 06/25/2020).    Macario Carls Leticia Mcdiarmid, FNP-BC [facility] [address] [place], [place] [address] Ph.  [phone] Fax [phone]

## 2020-03-30 LAB — URINE CULTURE

## 2020-03-31 LAB — URINE CYTOLOGY ANCILLARY ONLY
Bacterial Vaginitis-Urine: NEGATIVE
Candida Urine: NEGATIVE
Chlamydia: NEGATIVE
Comment: NEGATIVE
Comment: NEGATIVE
Comment: NORMAL
Neisseria Gonorrhea: NEGATIVE
Trichomonas: NEGATIVE

## 2020-05-24 ENCOUNTER — Other Ambulatory Visit: Payer: Self-pay | Admitting: Family Medicine

## 2020-05-24 DIAGNOSIS — K219 Gastro-esophageal reflux disease without esophagitis: Secondary | ICD-10-CM

## 2020-05-24 NOTE — Telephone Encounter (Signed)
   Notes to clinic Not a provider we approve rx for.  

## 2020-06-21 MED ORDER — PANTOPRAZOLE SODIUM 40 MG PO TBEC: 40.0000 mg | DELAYED_RELEASE_TABLET | Freq: Every day | ORAL | 0 refills | Status: AC

## 2020-06-21 NOTE — Addendum Note (Signed)
Addended by: Meredith Staggers R on: 06/21/2020 05:50 PM   Modules accepted: Orders

## 2020-06-21 NOTE — Telephone Encounter (Signed)
Refill request

## 2020-06-28 ENCOUNTER — Ambulatory Visit: Payer: 59 | Admitting: Family Medicine

## 2021-07-08 IMAGING — DX RIGHT ELBOW - COMPLETE 3+ VIEW
4 series · 4 of 4 positions shown · non-contrast
Comparison: None

CLINICAL DATA: Elbow pain with injury, decreased range of motion

EXAM:
RIGHT ELBOW - COMPLETE 3+ VIEW

[elbow ap]
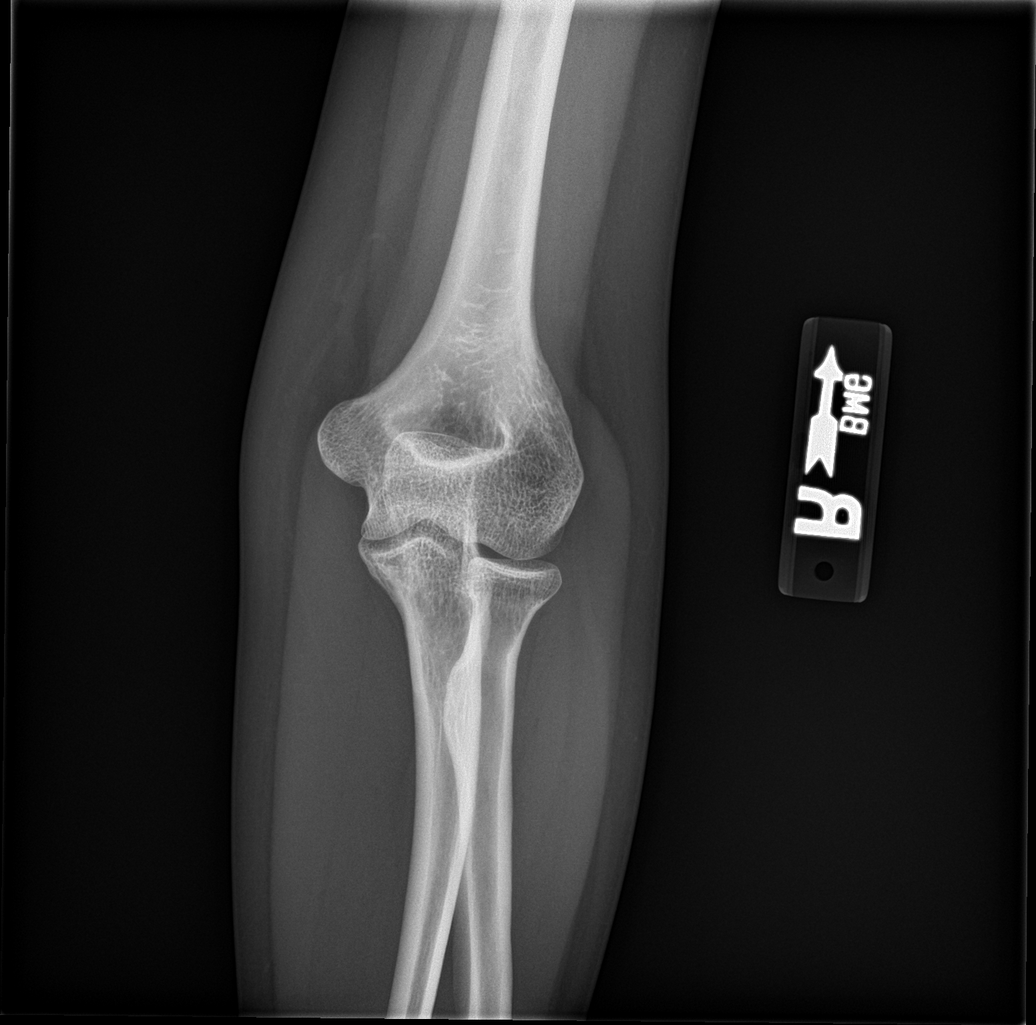

[elbow obl (1 of 2)]
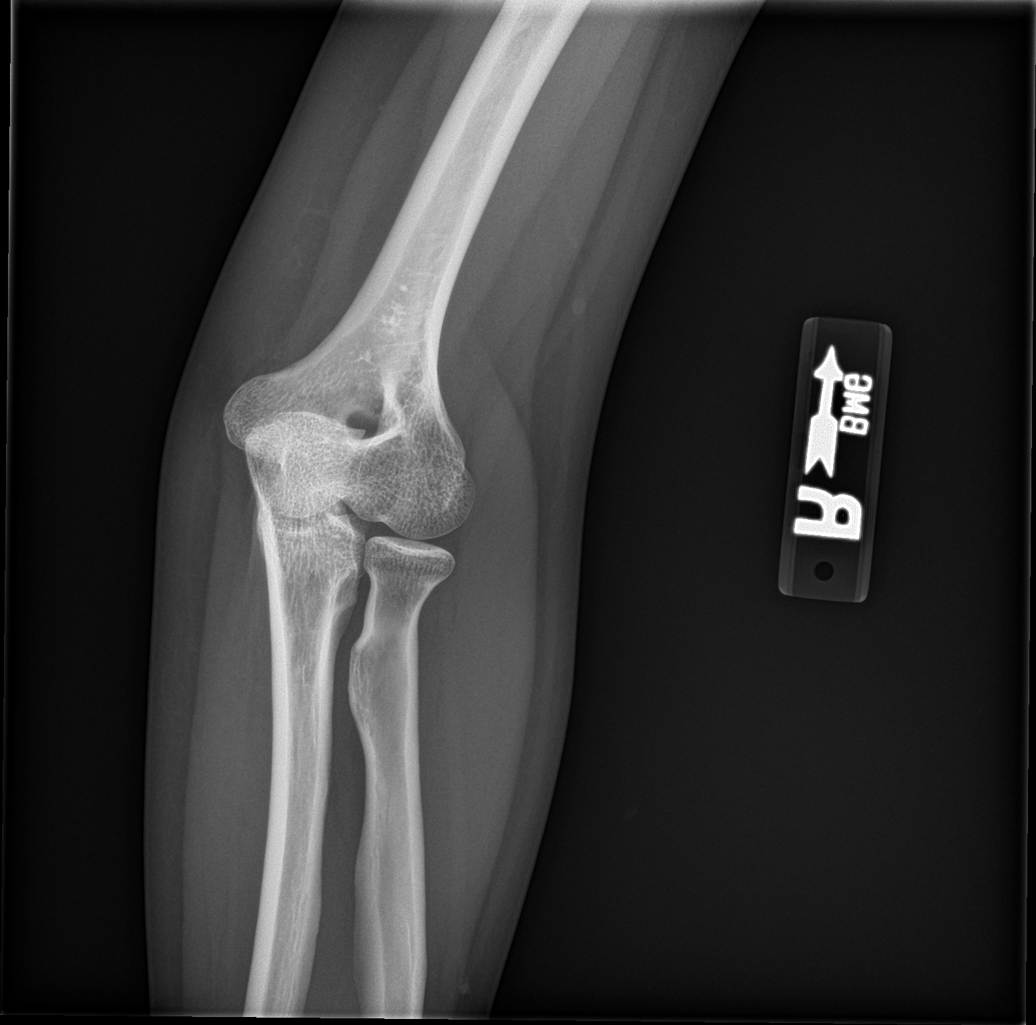

[elbow obl (2 of 2)]
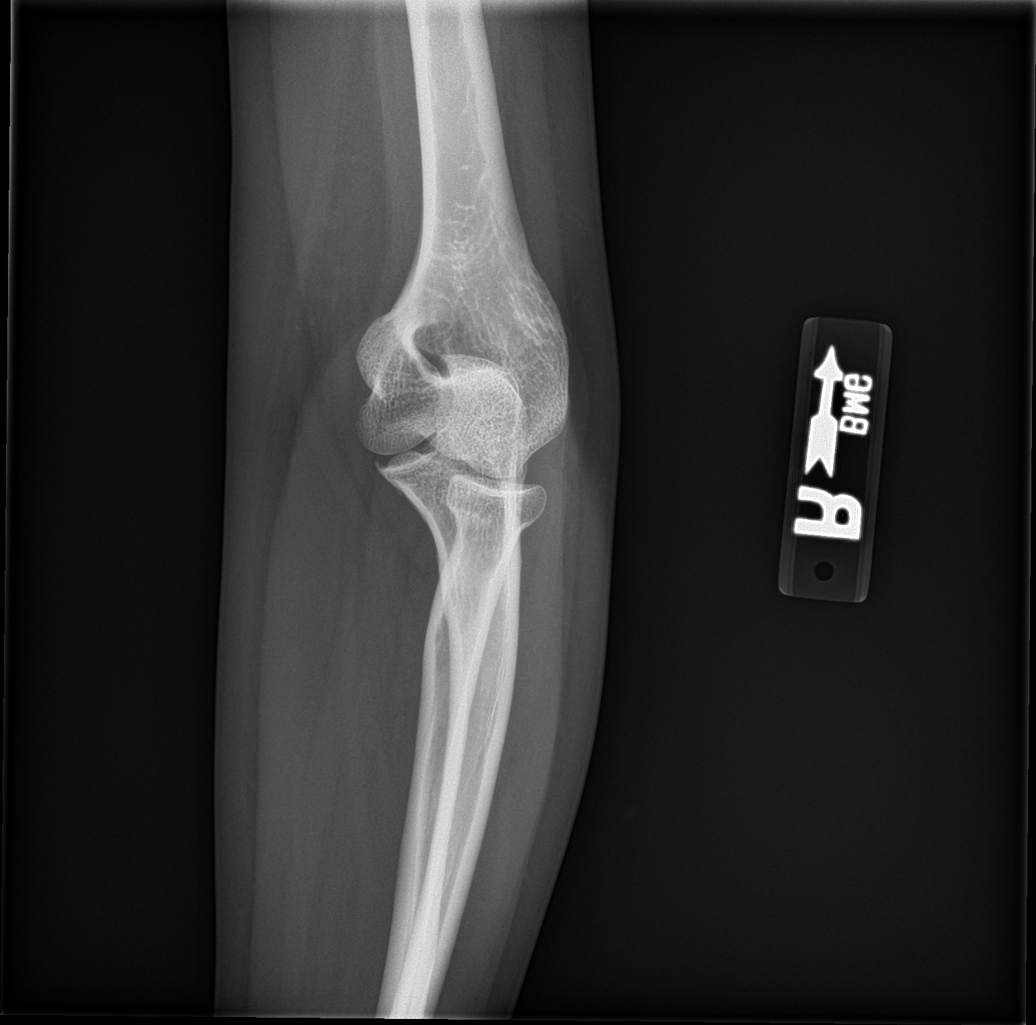

[elbow lat]
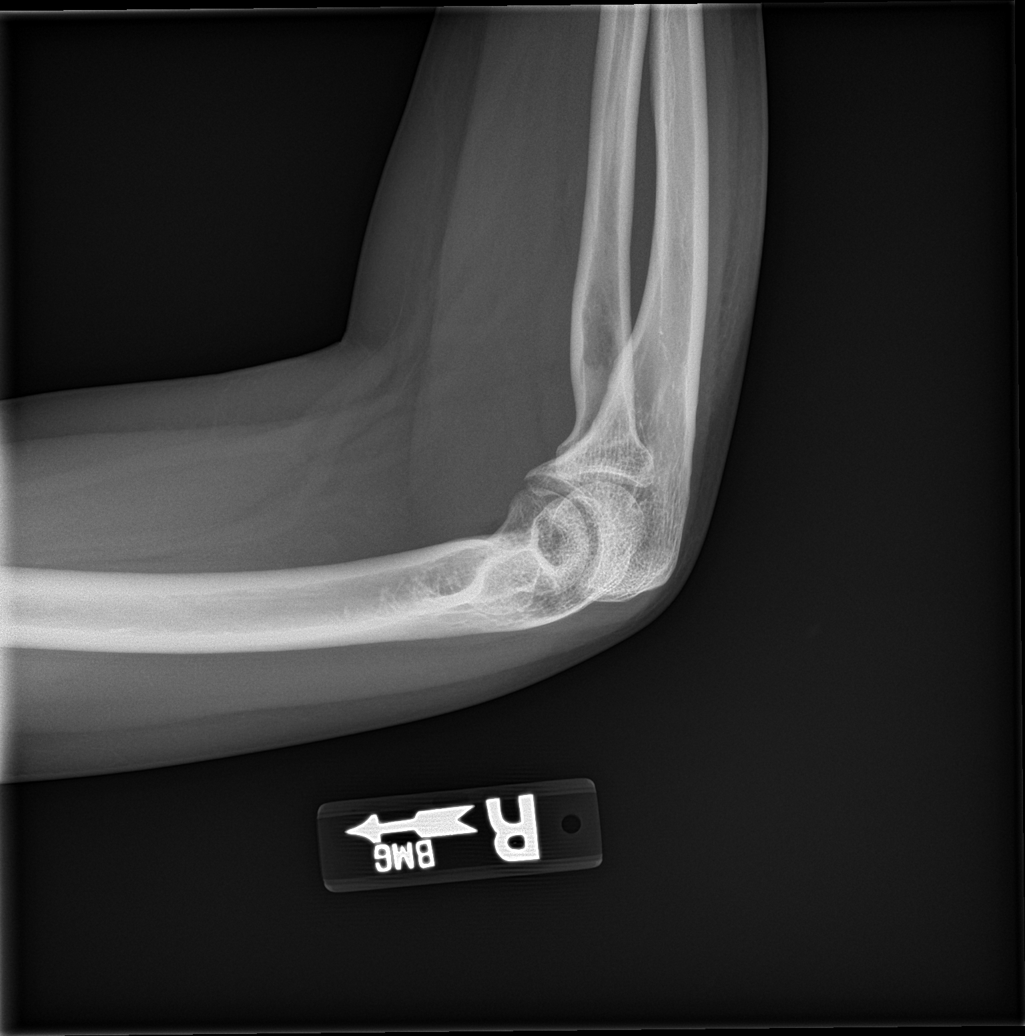

[4 of 4 positions shown; findings below may reference images not displayed]

FINDINGS: Osseous mineralization normal.

Joint spaces preserved.

No fracture, dislocation, or bone destruction.

No joint effusion.
IMPRESSION: Normal exam.
